# Patient Record
Sex: Male | Born: 1990 | Race: White | Hispanic: No | Marital: Married | Smoking: Current every day smoker
Health system: Southern US, Community
[De-identification: ages and names within clinical notes are randomized; demographics above are authoritative.]

## PROBLEM LIST (undated history)

## (undated) DIAGNOSIS — K029 Dental caries, unspecified: Secondary | ICD-10-CM

## (undated) HISTORY — PX: OTHER SURGICAL HISTORY: SHX169

---
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
## 2009-10-05 ENCOUNTER — Emergency Department (HOSPITAL_COMMUNITY): Admission: EM | Admit: 2009-10-05 | Discharge: 2009-10-05 | Payer: Self-pay | Admitting: Emergency Medicine

## 2010-04-28 ENCOUNTER — Emergency Department (HOSPITAL_COMMUNITY): Admission: EM | Admit: 2010-04-28 | Discharge: 2010-04-28 | Payer: Self-pay | Admitting: Emergency Medicine

## 2010-05-01 ENCOUNTER — Telehealth: Payer: Self-pay | Admitting: Family Medicine
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
## 2010-09-12 ENCOUNTER — Encounter: Payer: Self-pay | Admitting: *Deleted

## 2010-12-09 ENCOUNTER — Emergency Department (HOSPITAL_COMMUNITY)
Admission: EM | Admit: 2010-12-09 | Discharge: 2010-12-09 | Disposition: A | Discharge status: Home / Self Care | Payer: Self-pay | Attending: Emergency Medicine | Admitting: Emergency Medicine

## 2010-12-09 DIAGNOSIS — K029 Dental caries, unspecified: Secondary | ICD-10-CM | POA: Insufficient documentation

## 2010-12-09 DIAGNOSIS — K089 Disorder of teeth and supporting structures, unspecified: Secondary | ICD-10-CM | POA: Insufficient documentation

## 2011-10-18 ENCOUNTER — Emergency Department (HOSPITAL_COMMUNITY): Payer: Self-pay

## 2011-10-18 ENCOUNTER — Emergency Department (HOSPITAL_COMMUNITY)
Admission: EM | Admit: 2011-10-18 | Discharge: 2011-10-18 | Disposition: A | Discharge status: Home / Self Care | Payer: Self-pay | Attending: Emergency Medicine | Admitting: Emergency Medicine

## 2011-10-18 ENCOUNTER — Encounter (HOSPITAL_COMMUNITY): Payer: Self-pay | Admitting: Emergency Medicine

## 2011-10-18 DIAGNOSIS — M25529 Pain in unspecified elbow: Secondary | ICD-10-CM | POA: Insufficient documentation

## 2011-10-18 DIAGNOSIS — F17200 Nicotine dependence, unspecified, uncomplicated: Secondary | ICD-10-CM | POA: Insufficient documentation

## 2011-10-18 DIAGNOSIS — M79609 Pain in unspecified limb: Secondary | ICD-10-CM | POA: Insufficient documentation

## 2011-10-18 DIAGNOSIS — M2550 Pain in unspecified joint: Secondary | ICD-10-CM | POA: Insufficient documentation

## 2011-10-18 MED ORDER — HYDROCODONE-ACETAMINOPHEN 5-325 MG PO TABS
1.0000 | ORAL_TABLET | Freq: Once | ORAL | Status: AC
  Administered 2011-10-18: 1 via ORAL
  Filled 2011-10-18: qty 1

## 2011-10-18 MED ORDER — IBUPROFEN 800 MG PO TABS
800.0000 mg | ORAL_TABLET | Freq: Once | ORAL | Status: AC
  Administered 2011-10-18: 800 mg via ORAL
  Filled 2011-10-18: qty 1

## 2011-10-18 MED ORDER — NAPROXEN 375 MG PO TABS: 375.0000 mg | ORAL_TABLET | Freq: Two times a day (BID) | ORAL | Status: DC

## 2011-10-18 MED ORDER — HYDROCODONE-ACETAMINOPHEN 5-325 MG PO TABS: 1.0000 | ORAL_TABLET | Freq: Four times a day (QID) | ORAL | Status: AC | PRN

## 2011-10-18 NOTE — ED Notes (Signed)
Pt presented to the Er with c/o right arm pain, states started yesterday, pt further explains that it could be possible spider bite and the he noted knots to the right arm, wrist, brachial area and under arm. Pt reports pain level as 7/10
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
## 2011-10-18 NOTE — Discharge Instructions (Signed)
Cryotherapy  Cryotherapy means treatment with cold. Ice or gel packs can be used to reduce both pain and swelling. Ice is the most helpful within the first 24 to 48 hours after an injury or flareup from overusing a muscle or joint. Sprains, strains, spasms, burning pain, shooting pain, and aches can all be eased with ice. Ice can also be used when recovering from surgery. Ice is effective, has very few side effects, and is safe for most people to use.  PRECAUTIONS    Ice is not a safe treatment option for people with:   Raynaud's phenomenon. This is a condition affecting small blood vessels in the extremities. Exposure to cold may cause your problems to return.    Cold hypersensitivity. There are many forms of cold hypersensitivity, including:    Cold urticaria. Red, itchy hives appear on the skin when the tissues begin to warm after being iced.    Cold erythema. This is a red, itchy rash caused by exposure to cold.    Cold hemoglobinuria. Red blood cells break down when the tissues begin to warm after being iced. The hemoglobin that carry oxygen are passed into the urine because they cannot combine with blood proteins fast enough.    Numbness or altered sensitivity in the area being iced.   If you have any of the following conditions, do not use ice until you have discussed cryotherapy with your caregiver:   Heart conditions, such as arrhythmia, angina, or chronic heart disease.    High blood pressure.    Healing wounds or open skin in the area being iced.    Current infections.    Rheumatoid arthritis.    Poor circulation.    Diabetes.   Ice slows the blood flow in the region it is applied. This is beneficial when trying to stop inflamed tissues from spreading irritating chemicals to surrounding tissues. However, if you expose your skin to cold temperatures for too long or without the proper protection, you can damage your skin or nerves. Watch for signs of skin damage due to cold.   HOME CARE INSTRUCTIONS  Follow these tips to use ice and cold packs safely.   Place a dry or damp towel between the ice and skin. A damp towel will cool the skin more quickly, so you may need to shorten the time that the ice is used.    For a more rapid response, add gentle compression to the ice.    Ice for no more than 10 to 20 minutes at a time. The bonier the area you are icing, the less time it will take to get the benefits of ice.    Check your skin after 5 minutes to make sure there are no signs of a poor response to cold or skin damage.    Rest 20 minutes or more in between uses.    Once your skin is numb, you can end your treatment. You can test numbness by very lightly touching your skin. The touch should be so light that you do not see the skin dimple from the pressure of your fingertip. When using ice, most people will feel these normal sensations in this order: cold, burning, aching, and numbness.    Do not use ice on someone who cannot communicate their responses to pain, such as small children or people with dementia.   HOW TO MAKE AN ICE PACK  Ice packs are the most common way to use ice therapy. Other methods include ice   massage, ice baths, and cryo-sprays. Muscle creams that cause a cold, tingly feeling do not offer the same benefits that ice offers and should not be used as a substitute unless recommended by your caregiver.  To make an ice pack, do one of the following:   Place crushed ice or a bag of frozen vegetables in a sealable plastic bag. Squeeze out the excess air. Place this bag inside another plastic bag. Slide the bag into a pillowcase or place a damp towel between your skin and the bag.     Mix 3 parts water with 1 part rubbing alcohol. Freeze the mixture in a sealable plastic bag. When you remove the mixture from the freezer, it will be slushy. Squeeze out the excess air. Place this bag inside another plastic bag. Slide the bag into a pillowcase or place a damp towel between your skin and the bag.   SEEK MEDICAL CARE IF:   You develop white spots on your skin. This may give the skin a blotchy (mottled) appearance.    Your skin turns blue or pale.    Your skin becomes waxy or hard.    Your swelling gets worse.   MAKE SURE YOU:     Understand these instructions.    Will watch your condition.    Will get help right away if you are not doing well or get worse.   Document Released: 03/19/2011 Document Revised: 07/12/2011 Document Reviewed: 03/19/2011  [employer] Patient Information 2012 [employer].

## 2011-10-19 NOTE — ED Provider Notes (Signed)
Medical screening examination/treatment/procedure(s) were performed by non-physician practitioner and as supervising physician I was immediately available for consultation/collaboration.   Hurman Horn, MD 10/19/11 2330

## 2011-11-13 ENCOUNTER — Emergency Department (HOSPITAL_COMMUNITY)
Admission: EM | Admit: 2011-11-13 | Discharge: 2011-11-13 | Disposition: A | Discharge status: Home / Self Care | Payer: Self-pay | Attending: Emergency Medicine | Admitting: Emergency Medicine

## 2011-11-13 DIAGNOSIS — IMO0002 Reserved for concepts with insufficient information to code with codable children: Secondary | ICD-10-CM

## 2011-11-13 DIAGNOSIS — L723 Sebaceous cyst: Secondary | ICD-10-CM | POA: Insufficient documentation

## 2011-11-13 DIAGNOSIS — IMO0001 Reserved for inherently not codable concepts without codable children: Secondary | ICD-10-CM | POA: Insufficient documentation

## 2011-11-13 MED ORDER — HYDROCODONE-ACETAMINOPHEN 5-500 MG PO TABS: 1.0000 | ORAL_TABLET | Freq: Four times a day (QID) | ORAL | Status: AC | PRN

## 2011-11-13 NOTE — Discharge Instructions (Signed)
Ganglion A ganglion is a swelling under the skin that is filled with a thick, jelly-like substance. It is a synovial cyst. This is caused by a break (rupture) of the joint lining from the joint space. A ganglion often occurs near an area of repeated minor trauma (damage caused by an accident). Trauma may also be a repetitive movement at work or in a sport. TREATMENT   It often goes away without treatment. It may reappear later. Sometimes a ganglion may need to be surgically removed. Often they are drained and injected with a steroid. Sometimes they respond to:  Rest.   Splinting.  HOME CARE INSTRUCTIONS    Your caregiver will decide the best way of treating your ganglion. Do not try to break the ganglion yourself by pressing on it, poking it with a needle, or hitting it with a heavy object.   Use medications as directed.  SEEK MEDICAL CARE IF:    The ganglion becomes larger or more painful.   You have increased redness or swelling.   You have weakness or numbness in your hand or wrist.  MAKE SURE YOU:    Understand these instructions.   Will watch your condition.   Will get help right away if you are not doing well or get worse.  Document Released: 07/20/2000 Document Revised: 07/12/2011 Document Reviewed: 09/16/2007 [employer] Patient Information 2012 [employer]. 
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
## 2011-11-13 NOTE — ED Notes (Signed)
Was seen here 1 month ago for knot on inner aspect of right arm. Was dx'd with "muscle spasm". Knot has gotten bigger and more tender.

## 2011-11-14 NOTE — ED Provider Notes (Signed)
Medical screening examination/treatment/procedure(s) were performed by non-physician practitioner and as supervising physician I was immediately available for consultation/collaboration.  Cheri Guppy, MD 11/14/11 [phone]

## 2012-01-31 ENCOUNTER — Encounter (HOSPITAL_COMMUNITY): Payer: Self-pay | Admitting: *Deleted

## 2012-01-31 ENCOUNTER — Emergency Department (HOSPITAL_COMMUNITY)
Admission: EM | Admit: 2012-01-31 | Discharge: 2012-01-31 | Disposition: A | Discharge status: Home / Self Care | Payer: Self-pay | Attending: Emergency Medicine | Admitting: Emergency Medicine

## 2012-01-31 DIAGNOSIS — F17200 Nicotine dependence, unspecified, uncomplicated: Secondary | ICD-10-CM | POA: Insufficient documentation

## 2012-01-31 DIAGNOSIS — M25562 Pain in left knee: Secondary | ICD-10-CM

## 2012-01-31 DIAGNOSIS — M25569 Pain in unspecified knee: Secondary | ICD-10-CM | POA: Insufficient documentation

## 2012-01-31 DIAGNOSIS — M549 Dorsalgia, unspecified: Secondary | ICD-10-CM | POA: Insufficient documentation

## 2012-01-31 MED ORDER — NAPROXEN 375 MG PO TABS: 375.0000 mg | ORAL_TABLET | Freq: Two times a day (BID) | ORAL | Status: DC

## 2012-01-31 MED ORDER — HYDROCODONE-ACETAMINOPHEN 5-325 MG PO TABS: 1.0000 | ORAL_TABLET | ORAL | Status: AC | PRN
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
## 2012-01-31 NOTE — Discharge Instructions (Signed)
Back Exercises Back exercises help treat and prevent back injuries. The goal of back exercises is to increase the strength of your abdominal and back muscles and the flexibility of your back. These exercises should be started when you no longer have back pain. Back exercises include:  Pelvic Tilt. Lie on your back with your knees bent. Tilt your pelvis until the lower part of your back is against the floor. Hold this position 5 to 10 sec and repeat 5 to 10 times.   Knee to Chest. Pull first 1 knee up against your chest and hold for 20 to 30 seconds, repeat this with the other knee, and then both knees. This may be done with the other leg straight or bent, whichever feels better.   Sit-Ups or Curl-Ups. Bend your knees 90 degrees. Start with tilting your pelvis, and do a partial, slow sit-up, lifting your trunk only 30 to 45 degrees off the floor. Take at least 2 to 3 seconds for each sit-up. Do not do sit-ups with your knees out straight. If partial sit-ups are difficult, simply do the above but with only tightening your abdominal muscles and holding it as directed.   Hip-Lift. Lie on your back with your knees flexed 90 degrees. Push down with your feet and shoulders as you raise your hips a couple inches off the floor; hold for 10 seconds, repeat 5 to 10 times.   Back arches. Lie on your stomach, propping yourself up on bent elbows. Slowly press on your hands, causing an arch in your low back. Repeat 3 to 5 times. Any initial stiffness and discomfort should lessen with repetition over time.   Shoulder-Lifts. Lie face down with arms beside your body. Keep hips and torso pressed to floor as you slowly lift your head and shoulders off the floor.  Do not overdo your exercises, especially in the beginning. Exercises may cause you some mild back discomfort which lasts for a few minutes; however, if the pain is more severe, or lasts for more than 15 minutes, do not continue exercises until you see your  caregiver. Improvement with exercise therapy for back problems is slow.  See your caregivers for assistance with developing a proper back exercise program. Document Released: 08/30/2004 Document Revised: 07/12/2011 Document Reviewed: 07/23/2005 [facility] Patient Information 2012 [place], [place].Arthralgia Your caregiver has diagnosed you as suffering from an arthralgia. Arthralgia means there is pain in a joint. This can come from many reasons including:  Bruising the joint which causes soreness (inflammation) in the joint.   Wear and tear on the joints which occur as we grow older (osteoarthritis).   Overusing the joint.   Various forms of arthritis.   Infections of the joint.  Regardless of the cause of pain in your joint, most of these different pains respond to anti-inflammatory drugs and rest. The exception to this is when a joint is infected, and these cases are treated with antibiotics, if it is a bacterial infection. HOME CARE INSTRUCTIONS   Rest the injured area for as long as directed by your caregiver. Then slowly start using the joint as directed by your caregiver and as the pain allows. Crutches as directed may be useful if the ankles, knees or hips are involved. If the knee was splinted or casted, continue use and care as directed. If an stretchy or elastic wrapping bandage has been applied today, it should be removed and re-applied every 3 to 4 hours. It should not be applied tightly, but firmly enough  to keep swelling down. Watch toes and feet for swelling, bluish discoloration, coldness, numbness or excessive pain. If any of these problems (symptoms) occur, remove the ace bandage and re-apply more loosely. If these symptoms persist, contact your caregiver or return to this location.   For the first 24 hours, keep the injured extremity elevated on pillows while lying down.   Apply ice for 15 to 20 minutes to the sore joint every couple hours while awake for the first half day.  Then 3 to 4 times per day for the first 48 hours. Put the ice in a plastic bag and place a towel between the bag of ice and your skin.   Wear any splinting, casting, elastic bandage applications, or slings as instructed.   Only take over-the-counter or prescription medicines for pain, discomfort, or fever as directed by your caregiver. Do not use aspirin immediately after the injury unless instructed by your physician. Aspirin can cause increased bleeding and bruising of the tissues.   If you were given crutches, continue to use them as instructed and do not resume weight bearing on the sore joint until instructed.  Persistent pain and inability to use the sore joint as directed for more than 2 to 3 days are warning signs indicating that you should see a caregiver for a follow-up visit as soon as possible. Initially, a hairline fracture (break in bone) may not be evident on X-rays. Persistent pain and swelling indicate that further evaluation, non-weight bearing or use of the joint (use of crutches or slings as instructed), or further X-rays are indicated. X-rays may sometimes not show a small fracture until a week or 10 days later. Make a follow-up appointment with your own caregiver or one to whom we have referred you. A radiologist (specialist in reading X-rays) may read your X-rays. Make sure you know how you are to obtain your X-ray results. Do not assume everything is normal if you do not hear from [place]. SEEK MEDICAL CARE IF: Bruising, swelling, or pain increases. SEEK IMMEDIATE MEDICAL CARE IF:   Your fingers or toes are numb or blue.   The pain is not responding to medications and continues to stay the same or get worse.   The pain in your joint becomes severe.   You develop a fever over 102 F (38.9 C).   It becomes impossible to move or use the joint.  MAKE SURE YOU:   Understand these instructions.   Will watch your condition.   Will get help right away if you are not doing well  or get worse.  Document Released: 07/23/2005 Document Revised: 07/12/2011 Document Reviewed: 03/10/2008 [facility] Patient Information 2012 [place], [place].Back Pain, Adult Low back pain is very common. About 1 in 5 people have back pain.The cause of low back pain is rarely dangerous. The pain often gets better over time.About half of people with a sudden onset of back pain feel better in just 2 weeks. About 8 in 10 people feel better by 6 weeks.  CAUSES Some common causes of back pain include:  Strain of the muscles or ligaments supporting the spine.   Wear and tear (degeneration) of the spinal discs.   Arthritis.   Direct injury to the back.  DIAGNOSIS Most of the time, the direct cause of low back pain is not known.However, back pain can be treated effectively even when the exact cause of the pain is unknown.Answering your caregiver's questions about your overall health and symptoms is one of the  most accurate ways to make sure the cause of your pain is not dangerous. If your caregiver needs more information, he or she may order lab work or imaging tests (X-rays or MRIs).However, even if imaging tests show changes in your back, this usually does not require surgery. HOME CARE INSTRUCTIONS For many people, back pain returns.Since low back pain is rarely dangerous, it is often a condition that people can learn to [facility] their own.   Remain active. It is stressful on the back to sit or stand in one place. Do not sit, drive, or stand in one place for more than 30 minutes at a time. Take short walks on level surfaces as soon as pain allows.Try to increase the length of time you walk each day.   Do not stay in bed.Resting more than 1 or 2 days can delay your recovery.   Do not avoid exercise or work.Your body is made to move.It is not dangerous to be active, even though your back may hurt.Your back will likely heal faster if you return to being active before your pain is gone.   Pay  attention to your body when you bend and lift. Many people have less discomfortwhen lifting if they bend their knees, keep the load close to their bodies,and avoid twisting. Often, the most comfortable positions are those that put less stress on your recovering back.   Find a comfortable position to sleep. Use a firm mattress and lie on your side with your knees slightly bent. If you lie on your back, put a pillow under your knees.   Only take over-the-counter or prescription medicines as directed by your caregiver. Over-the-counter medicines to reduce pain and inflammation are often the most helpful.Your caregiver may prescribe muscle relaxant drugs.These medicines help dull your pain so you can more quickly return to your normal activities and healthy exercise.   Put ice on the injured area.   Put ice in a plastic bag.   Place a towel between your skin and the bag.   Leave the ice on for 15 to 20 minutes, 3 to 4 times a day for the first 2 to 3 days. After that, ice and heat may be alternated to reduce pain and spasms.   Ask your caregiver about trying back exercises and gentle massage. This may be of some benefit.   Avoid feeling anxious or stressed.Stress increases muscle tension and can worsen back pain.It is important to recognize when you are anxious or stressed and learn ways to manage it.Exercise is a great option.  SEEK MEDICAL CARE IF:  You have pain that is not relieved with rest or medicine.   You have pain that does not improve in 1 week.   You have new symptoms.   You are generally not feeling well.  SEEK IMMEDIATE MEDICAL CARE IF:   You have pain that radiates from your back into your legs.   You develop new bowel or bladder control problems.   You have unusual weakness or numbness in your arms or legs.   You develop nausea or vomiting.   You develop abdominal pain.   You feel faint.  Document Released: 07/23/2005 Document Revised: 07/12/2011 Document  Reviewed: 12/11/2010 [facility] Patient Information 2012 [place], [place].  RESOURCE GUIDE  Dental Problems  Patients with Medicaid: [facility] [phone] [address]  [address] Phone:  [phone]                                                  Phone:  [phone]  If unable to pay or uninsured, contact:  [facility] or [facility]. to become qualified for the adult dental clinic.  Chronic Pain Problems Contact [facility]  [phone] Patients need to be referred by their primary care doctor.  Insufficient Money for Medicine Contact [employer]:  call "211" or [facility] [phone].  No Primary Care Doctor Call [facility]  [phone] Other agencies that provide inexpensive medical care    [facility]  [phone]    [facility]  [phone]    [facility]  [phone]    [facility]  [phone]    [facility]  [phone]    [facility]  [phone]  Psychological Services [facility]  [phone] [facility] Services  [phone] [facility]   [phone] (emergency services [phone])  Substance Abuse Resources [facility]  [phone] [facility] [phone] [facility] [place] [phone] Floydene Flock [phone] Residential & Outpatient Substance Abuse Program  [phone]  Abuse/Neglect [facility] Child Abuse Hotline 319-363-7018 [facility] Child Abuse Hotline 231-089-8668 (After Hours)  Emergency Shelter [facility] [phone]  Maternity Homes Room at the [place] [facility] [phone] [facility] [phone]  MRSA Hotline #:   514-118-8914    [facility] Resources  [facility] [place]     [employer]                          [facility]  [address] [place]                       [address]      [address]  Blondell Reveal Phone:  [phone]                                   Phone:  [phone]                 Phone:  [phone]  [facility] Phone:  [phone]  [facility] Child Abuse Hotline [phone])270-8898 [phone] (After Hours)

## 2012-01-31 NOTE — ED Provider Notes (Signed)
Medical screening examination/treatment/procedure(s) were performed by non-physician practitioner and as supervising physician I was immediately available for consultation/collaboration.    Nelia Shi, MD 01/31/12 [phone]

## 2012-01-31 NOTE — ED Notes (Signed)
Pt states he was lifting up equiptment. Pt state a pain shot up his back and both knees. Pt states he has knee pain for one month and it has became worst. No c/o injury

## 2012-03-03 ENCOUNTER — Emergency Department (HOSPITAL_COMMUNITY)
Admission: EM | Admit: 2012-03-03 | Discharge: 2012-03-03 | Disposition: A | Discharge status: Home / Self Care | Payer: Self-pay | Attending: Emergency Medicine | Admitting: Emergency Medicine

## 2012-03-03 ENCOUNTER — Emergency Department (HOSPITAL_COMMUNITY): Payer: Self-pay

## 2012-03-03 ENCOUNTER — Encounter (HOSPITAL_COMMUNITY): Payer: Self-pay | Admitting: Emergency Medicine

## 2012-03-03 DIAGNOSIS — M25569 Pain in unspecified knee: Secondary | ICD-10-CM | POA: Insufficient documentation

## 2012-03-03 DIAGNOSIS — F17200 Nicotine dependence, unspecified, uncomplicated: Secondary | ICD-10-CM | POA: Insufficient documentation

## 2012-03-03 DIAGNOSIS — M25561 Pain in right knee: Secondary | ICD-10-CM

## 2012-03-03 MED ORDER — NAPROXEN 375 MG PO TABS: 375.0000 mg | ORAL_TABLET | Freq: Two times a day (BID) | ORAL | Status: DC

## 2012-03-03 MED ORDER — NAPROXEN 500 MG PO TABS
250.0000 mg | ORAL_TABLET | Freq: Two times a day (BID) | ORAL | Status: DC
  Administered 2012-03-03: 250 mg via ORAL
  Filled 2012-03-03: qty 2

## 2012-03-03 NOTE — ED Notes (Signed)
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 Last took naproxyn 3 weeks ago. Pt ambulatory at triage.
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
## 2012-03-05 NOTE — ED Provider Notes (Signed)
Medical screening examination/treatment/procedure(s) were performed by non-physician practitioner and as supervising physician I was immediately available for consultation/collaboration.  Harley Fitzwater, MD 03/05/12 0010 

## 2012-09-07 ENCOUNTER — Emergency Department (HOSPITAL_COMMUNITY)
Admission: EM | Admit: 2012-09-07 | Discharge: 2012-09-07 | Disposition: A | Discharge status: Home / Self Care | Payer: BC Managed Care – PPO | Attending: Emergency Medicine | Admitting: Emergency Medicine

## 2012-09-07 ENCOUNTER — Encounter (HOSPITAL_COMMUNITY): Payer: Self-pay | Admitting: Emergency Medicine

## 2012-09-07 DIAGNOSIS — Z87828 Personal history of other (healed) physical injury and trauma: Secondary | ICD-10-CM | POA: Insufficient documentation

## 2012-09-07 DIAGNOSIS — M545 Low back pain: Secondary | ICD-10-CM

## 2012-09-07 DIAGNOSIS — Y990 Civilian activity done for income or pay: Secondary | ICD-10-CM | POA: Insufficient documentation

## 2012-09-07 DIAGNOSIS — X500XXA Overexertion from strenuous movement or load, initial encounter: Secondary | ICD-10-CM | POA: Insufficient documentation

## 2012-09-07 DIAGNOSIS — Y9389 Activity, other specified: Secondary | ICD-10-CM | POA: Insufficient documentation

## 2012-09-07 DIAGNOSIS — F17200 Nicotine dependence, unspecified, uncomplicated: Secondary | ICD-10-CM | POA: Insufficient documentation

## 2012-09-07 DIAGNOSIS — IMO0002 Reserved for concepts with insufficient information to code with codable children: Secondary | ICD-10-CM | POA: Insufficient documentation

## 2012-09-07 DIAGNOSIS — Y9269 Other specified industrial and construction area as the place of occurrence of the external cause: Secondary | ICD-10-CM | POA: Insufficient documentation

## 2012-09-07 MED ORDER — DIAZEPAM 5 MG PO TABS
5.0000 mg | ORAL_TABLET | Freq: Once | ORAL | Status: AC
  Administered 2012-09-07: 5 mg via ORAL
  Filled 2012-09-07: qty 1

## 2012-09-07 MED ORDER — DIAZEPAM 5 MG PO TABS: 5.0000 mg | ORAL_TABLET | Freq: Two times a day (BID) | ORAL | Status: DC

## 2012-09-07 MED ORDER — IBUPROFEN 400 MG PO TABS
800.0000 mg | ORAL_TABLET | Freq: Once | ORAL | Status: AC
  Administered 2012-09-07: 800 mg via ORAL
  Filled 2012-09-07: qty 2

## 2012-09-07 NOTE — ED Notes (Signed)
Pt waiting to be triaged by RN in triage room 2.

## 2012-09-07 NOTE — ED Notes (Signed)
Stated back hurts so bad from lifting at work

## 2012-09-07 NOTE — ED Notes (Signed)
Patient reports that he was lifting heavy wood at work this week and started to have back pain on Friday; patient reports that pain has not gotten any better over the weekend.  Patient ambulatory in triage; able to move all extremities without difficulty.
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
## 2012-09-09 NOTE — ED Provider Notes (Signed)
Medical screening examination/treatment/procedure(s) were performed by non-physician practitioner and as supervising physician I was immediately available for consultation/collaboration.  Doug Sou, MD 09/09/12 1331

## 2013-08-18 ENCOUNTER — Encounter (HOSPITAL_COMMUNITY): Payer: Self-pay | Admitting: Emergency Medicine

## 2013-08-18 ENCOUNTER — Emergency Department (HOSPITAL_COMMUNITY)
Admission: EM | Admit: 2013-08-18 | Discharge: 2013-08-18 | Disposition: A | Discharge status: Home / Self Care | Payer: Medicaid Other | Attending: Emergency Medicine | Admitting: Emergency Medicine

## 2013-08-18 DIAGNOSIS — IMO0002 Reserved for concepts with insufficient information to code with codable children: Secondary | ICD-10-CM | POA: Insufficient documentation

## 2013-08-18 DIAGNOSIS — M545 Low back pain, unspecified: Secondary | ICD-10-CM

## 2013-08-18 DIAGNOSIS — X500XXA Overexertion from strenuous movement or load, initial encounter: Secondary | ICD-10-CM | POA: Insufficient documentation

## 2013-08-18 DIAGNOSIS — Z87828 Personal history of other (healed) physical injury and trauma: Secondary | ICD-10-CM | POA: Insufficient documentation

## 2013-08-18 DIAGNOSIS — Y9289 Other specified places as the place of occurrence of the external cause: Secondary | ICD-10-CM | POA: Insufficient documentation

## 2013-08-18 DIAGNOSIS — Y990 Civilian activity done for income or pay: Secondary | ICD-10-CM | POA: Insufficient documentation

## 2013-08-18 DIAGNOSIS — Y9389 Activity, other specified: Secondary | ICD-10-CM | POA: Insufficient documentation

## 2013-08-18 DIAGNOSIS — F17200 Nicotine dependence, unspecified, uncomplicated: Secondary | ICD-10-CM | POA: Insufficient documentation

## 2013-08-18 MED ORDER — METHOCARBAMOL 500 MG PO TABS: 500.0000 mg | ORAL_TABLET | Freq: Two times a day (BID) | ORAL | Status: DC

## 2013-08-18 MED ORDER — MELOXICAM 7.5 MG PO TABS: 15.0000 mg | ORAL_TABLET | Freq: Every day | ORAL | Status: DC

## 2013-08-18 MED ORDER — OXYCODONE-ACETAMINOPHEN 5-325 MG PO TABS
2.0000 | ORAL_TABLET | Freq: Once | ORAL | Status: AC
  Administered 2013-08-18: 2 via ORAL
  Filled 2013-08-18: qty 2
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
## 2013-08-18 NOTE — ED Notes (Signed)
Patient reports that he was lifting something at work and pulled his back.

## 2013-08-18 NOTE — Discharge Instructions (Signed)
Apply ice to your back at least 30 minutes 4 times per day until symptoms improve. Take mobic and robaxin as prescribed. Refrain from strenuous activity or heavy lifting. Return if you develop fevers, loss of bowel/bladder function, numbness of your genitals, or an inability to walk.  Back Pain, Adult Low back pain is very common. About 1 in 5 people have back pain.The cause of low back pain is rarely dangerous. The pain often gets better over time.About half of people with a sudden onset of back pain feel better in just 2 weeks. About 8 in 10 people feel better by 6 weeks.  CAUSES Some common causes of back pain include:  Strain of the muscles or ligaments supporting the spine.  Wear and tear (degeneration) of the spinal discs.  Arthritis.  Direct injury to the back. DIAGNOSIS Most of the time, the direct cause of low back pain is not known.However, back pain can be treated effectively even when the exact cause of the pain is unknown.Answering your caregiver's questions about your overall health and symptoms is one of the most accurate ways to make sure the cause of your pain is not dangerous. If your caregiver needs more information, he or she may order lab work or imaging tests (X-rays or MRIs).However, even if imaging tests show changes in your back, this usually does not require surgery. HOME CARE INSTRUCTIONS For many people, back pain returns.Since low back pain is rarely dangerous, it is often a condition that people can learn to [facility] their own.   Remain active. It is stressful on the back to sit or stand in one place. Do not sit, drive, or stand in one place for more than 30 minutes at a time. Take short walks on level surfaces as soon as pain allows.Try to increase the length of time you walk each day.  Do not stay in bed.Resting more than 1 or 2 days can delay your recovery.  Do not avoid exercise or work.Your body is made to move.It is not dangerous to be active,  even though your back may hurt.Your back will likely heal faster if you return to being active before your pain is gone.  Pay attention to your body when you bend and lift. Many people have less discomfortwhen lifting if they bend their knees, keep the load close to their bodies,and avoid twisting. Often, the most comfortable positions are those that put less stress on your recovering back.  Find a comfortable position to sleep. Use a firm mattress and lie on your side with your knees slightly bent. If you lie on your back, put a pillow under your knees.  Only take over-the-counter or prescription medicines as directed by your caregiver. Over-the-counter medicines to reduce pain and inflammation are often the most helpful.Your caregiver may prescribe muscle relaxant drugs.These medicines help dull your pain so you can more quickly return to your normal activities and healthy exercise.  Put ice on the injured area.  Put ice in a plastic bag.  Place a towel between your skin and the bag.  Leave the ice on for 15-20 minutes, 03-04 times a day for the first 2 to 3 days. After that, ice and heat may be alternated to reduce pain and spasms.  Ask your caregiver about trying back exercises and gentle massage. This may be of some benefit.  Avoid feeling anxious or stressed.Stress increases muscle tension and can worsen back pain.It is important to recognize when you are anxious or stressed and learn  ways to manage it.Exercise is a great option. SEEK MEDICAL CARE IF:  You have pain that is not relieved with rest or medicine.  You have pain that does not improve in 1 week.  You have new symptoms.  You are generally not feeling well. SEEK IMMEDIATE MEDICAL CARE IF:   You have pain that radiates from your back into your legs.  You develop new bowel or bladder control problems.  You have unusual weakness or numbness in your arms or legs.  You develop nausea or vomiting.  You develop  abdominal pain.  You feel faint. Document Released: 07/23/2005 Document Revised: 01/22/2012 Document Reviewed: 12/11/2010 [facility] Patient Information 2014 [place], [place].

## 2013-08-21 NOTE — ED Provider Notes (Signed)
Medical screening examination/treatment/procedure(s) were performed by non-physician practitioner and as supervising physician I was immediately available for consultation/collaboration.  EKG Interpretation   None         Audree CamelScott T Margarette Vannatter, MD 08/21/13 [phone]

## 2014-01-18 ENCOUNTER — Emergency Department (HOSPITAL_COMMUNITY): Payer: BC Managed Care – PPO

## 2014-01-18 ENCOUNTER — Emergency Department (HOSPITAL_COMMUNITY)
Admission: EM | Admit: 2014-01-18 | Discharge: 2014-01-18 | Disposition: A | Discharge status: Home / Self Care | Payer: BC Managed Care – PPO | Attending: Emergency Medicine | Admitting: Emergency Medicine

## 2014-01-18 ENCOUNTER — Encounter (HOSPITAL_COMMUNITY): Payer: Self-pay | Admitting: Emergency Medicine

## 2014-01-18 DIAGNOSIS — Y92410 Unspecified street and highway as the place of occurrence of the external cause: Secondary | ICD-10-CM | POA: Insufficient documentation

## 2014-01-18 DIAGNOSIS — Y9389 Activity, other specified: Secondary | ICD-10-CM | POA: Insufficient documentation

## 2014-01-18 DIAGNOSIS — F17200 Nicotine dependence, unspecified, uncomplicated: Secondary | ICD-10-CM | POA: Insufficient documentation

## 2014-01-18 DIAGNOSIS — S6990XA Unspecified injury of unspecified wrist, hand and finger(s), initial encounter: Secondary | ICD-10-CM | POA: Insufficient documentation

## 2014-01-18 DIAGNOSIS — T148XXA Other injury of unspecified body region, initial encounter: Secondary | ICD-10-CM

## 2014-01-18 DIAGNOSIS — S0990XA Unspecified injury of head, initial encounter: Secondary | ICD-10-CM | POA: Insufficient documentation

## 2014-01-18 DIAGNOSIS — IMO0002 Reserved for concepts with insufficient information to code with codable children: Secondary | ICD-10-CM | POA: Insufficient documentation

## 2014-01-18 DIAGNOSIS — Z79899 Other long term (current) drug therapy: Secondary | ICD-10-CM | POA: Insufficient documentation

## 2014-01-18 MED ORDER — METHOCARBAMOL 500 MG PO TABS: 1000.0000 mg | ORAL_TABLET | Freq: Four times a day (QID) | ORAL | Status: DC

## 2014-01-18 MED ORDER — TRAMADOL HCL 50 MG PO TABS: 50.0000 mg | ORAL_TABLET | Freq: Four times a day (QID) | ORAL | Status: DC | PRN

## 2014-01-18 MED ORDER — OXYCODONE-ACETAMINOPHEN 5-325 MG PO TABS
1.0000 | ORAL_TABLET | Freq: Once | ORAL | Status: AC
  Administered 2014-01-18: 1 via ORAL
  Filled 2014-01-18: qty 1

## 2014-01-18 MED ORDER — NAPROXEN 500 MG PO TABS: 500.0000 mg | ORAL_TABLET | Freq: Two times a day (BID) | ORAL | Status: DC

## 2014-01-18 NOTE — ED Notes (Signed)
Patient helped up to the bathroom.  
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
## 2014-01-18 NOTE — ED Notes (Signed)
Received bedside report from Chad, RN 

## 2014-01-18 NOTE — ED Notes (Signed)
Patient returned from X-ray 

## 2014-01-18 NOTE — ED Notes (Signed)
Ortho paged about Thumb Spica. Stated they would be down shortly. No thumb spicas in ED Ortho Room.

## 2014-01-18 NOTE — ED Provider Notes (Signed)
Medical screening examination/treatment/procedure(s) were performed by non-physician practitioner and as supervising physician I was immediately available for consultation/collaboration.   EKG Interpretation None        Somtochukwu Woollard W Missie Gehrig, MD 01/18/14 1527 

## 2014-01-18 NOTE — ED Notes (Signed)
Patient refused wheelchair. 

## 2014-01-18 NOTE — ED Notes (Signed)
Patient returned from CT
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
## 2014-01-18 NOTE — ED Notes (Signed)
Patient taken to CT.

## 2014-01-18 NOTE — ED Notes (Signed)
PA made aware of patients pain.  
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
## 2015-02-15 ENCOUNTER — Encounter (HOSPITAL_COMMUNITY): Payer: Self-pay | Admitting: *Deleted

## 2015-02-15 ENCOUNTER — Emergency Department (HOSPITAL_COMMUNITY)
Admission: EM | Admit: 2015-02-15 | Discharge: 2015-02-15 | Disposition: A | Discharge status: Home / Self Care | Payer: Medicaid Other | Attending: Emergency Medicine | Admitting: Emergency Medicine

## 2015-02-15 DIAGNOSIS — Z720 Tobacco use: Secondary | ICD-10-CM | POA: Diagnosis not present

## 2015-02-15 DIAGNOSIS — K029 Dental caries, unspecified: Secondary | ICD-10-CM | POA: Insufficient documentation

## 2015-02-15 DIAGNOSIS — Z791 Long term (current) use of non-steroidal anti-inflammatories (NSAID): Secondary | ICD-10-CM | POA: Insufficient documentation

## 2015-02-15 DIAGNOSIS — Z79899 Other long term (current) drug therapy: Secondary | ICD-10-CM | POA: Insufficient documentation

## 2015-02-15 DIAGNOSIS — K0889 Other specified disorders of teeth and supporting structures: Secondary | ICD-10-CM

## 2015-02-15 DIAGNOSIS — R51 Headache: Secondary | ICD-10-CM | POA: Diagnosis not present

## 2015-02-15 DIAGNOSIS — K088 Other specified disorders of teeth and supporting structures: Secondary | ICD-10-CM | POA: Diagnosis not present

## 2015-02-15 HISTORY — DX: Dental caries, unspecified: K02.9

## 2015-02-15 MED ORDER — LIDOCAINE VISCOUS 2 % MT SOLN
15.0000 mL | Freq: Once | OROMUCOSAL | Status: AC
  Administered 2015-02-15: 15 mL via OROMUCOSAL
  Filled 2015-02-15: qty 15

## 2015-02-15 MED ORDER — LIDOCAINE VISCOUS 2 % MT SOLN: 20.0000 mL | OROMUCOSAL | Status: DC | PRN

## 2015-02-15 MED ORDER — AMOXICILLIN 500 MG PO CAPS: 500.0000 mg | ORAL_CAPSULE | Freq: Three times a day (TID) | ORAL | Status: DC
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
## 2015-02-15 NOTE — Discharge Instructions (Signed)
Please follow up with your oral surgeon to schedule a follow up appointment. Please take your antibiotic until completion. Please alternate between Motrin and Tylenol every three hours for  pain. Please read all discharge instructions and return precautions.     Dental Pain A tooth ache may be caused by cavities (tooth decay). Cavities expose the nerve of the tooth to air and hot or cold temperatures. It may come from an infection or abscess (also called a boil or furuncle) around your tooth. It is also often caused by dental caries (tooth decay). This causes the pain you are having. DIAGNOSIS  Your caregiver can diagnose this problem by exam. TREATMENT   If caused by an infection, it may be treated with medications which kill germs (antibiotics) and pain medications as prescribed by your caregiver. Take medications as directed.  Only take over-the-counter or prescription medicines for pain, discomfort, or fever as directed by your caregiver.  Whether the tooth ache today is caused by infection or dental disease, you should see your dentist as soon as possible for further care. SEEK MEDICAL CARE IF: The exam and treatment you received today has been provided on an emergency basis only. This is not a substitute for complete medical or dental care. If your problem worsens or new problems (symptoms) appear, and you are unable to meet with your dentist, call or return to this location. SEEK IMMEDIATE MEDICAL CARE IF:   You have a fever.  You develop redness and swelling of your face, jaw, or neck.  You are unable to open your mouth.  You have severe pain uncontrolled by pain medicine. MAKE SURE YOU:   Understand these instructions.  Will watch your condition.  Will get help right away if you are not doing well or get worse. Document Released: 07/23/2005 Document Revised: 10/15/2011 Document Reviewed: 03/10/2008 [facility] Patient Information 2015 [employer], [place]. This information is not  intended to replace advice given to you by your health care provider. Make sure you discuss any questions you have with your health care provider.

## 2015-02-15 NOTE — ED Notes (Signed)
Pt reports He has an appt . with oral surgeon in 2 months . Pt reports an acute flair up of pain.

## 2015-02-15 NOTE — ED Notes (Signed)
Declined W/C at D/C and was escorted to lobby by RN. 

## 2015-03-19 ENCOUNTER — Emergency Department (HOSPITAL_COMMUNITY)
Admission: EM | Admit: 2015-03-19 | Discharge: 2015-03-19 | Disposition: A | Discharge status: Home / Self Care | Payer: Medicaid Other | Attending: Emergency Medicine | Admitting: Emergency Medicine

## 2015-03-19 ENCOUNTER — Encounter (HOSPITAL_COMMUNITY): Payer: Self-pay

## 2015-03-19 DIAGNOSIS — Z79899 Other long term (current) drug therapy: Secondary | ICD-10-CM | POA: Insufficient documentation

## 2015-03-19 DIAGNOSIS — Z720 Tobacco use: Secondary | ICD-10-CM | POA: Diagnosis not present

## 2015-03-19 DIAGNOSIS — K029 Dental caries, unspecified: Secondary | ICD-10-CM | POA: Insufficient documentation

## 2015-03-19 DIAGNOSIS — K088 Other specified disorders of teeth and supporting structures: Secondary | ICD-10-CM | POA: Diagnosis present

## 2015-03-19 DIAGNOSIS — Z792 Long term (current) use of antibiotics: Secondary | ICD-10-CM | POA: Diagnosis not present

## 2015-03-19 DIAGNOSIS — Z791 Long term (current) use of non-steroidal anti-inflammatories (NSAID): Secondary | ICD-10-CM | POA: Insufficient documentation

## 2015-03-19 DIAGNOSIS — K08409 Partial loss of teeth, unspecified cause, unspecified class: Secondary | ICD-10-CM

## 2015-03-19 DIAGNOSIS — K08109 Complete loss of teeth, unspecified cause, unspecified class: Secondary | ICD-10-CM | POA: Insufficient documentation

## 2015-03-19 DIAGNOSIS — K0889 Other specified disorders of teeth and supporting structures: Secondary | ICD-10-CM

## 2015-03-19 MED ORDER — OXYCODONE-ACETAMINOPHEN 5-325 MG PO TABS
1.0000 | ORAL_TABLET | Freq: Once | ORAL | Status: AC
  Administered 2015-03-19: 1 via ORAL

## 2015-03-19 MED ORDER — OXYCODONE-ACETAMINOPHEN 5-325 MG PO TABS
ORAL_TABLET | ORAL | Status: AC
  Filled 2015-03-19: qty 1

## 2015-03-19 MED ORDER — IBUPROFEN 800 MG PO TABS: 800.0000 mg | ORAL_TABLET | Freq: Three times a day (TID) | ORAL | Status: DC
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
## 2015-03-19 NOTE — ED Notes (Signed)
Pt reports had 23 teeth pulled 03-10-15 has stitches in mouth that are loose.  Pt is here for pain control and to have stitches checked.  He does not have f/u with oral surgeon scheduled.

## 2015-03-19 NOTE — Discharge Instructions (Signed)
1. Medications: Ibuprofen, usual home medications 2. Treatment: rest, drink plenty of fluids, take medications as prescribed, continue to eat soft foods 3. Follow Up: Please followup with dentistry within 1 week for discussion of your diagnoses and further evaluation after today's visit; if you do not have a primary care doctor use the resource guide provided to find one; Return to the ER for high fevers, difficulty breathing, difficulty swallowing or other concerning symptoms    Dental Pain A tooth ache may be caused by cavities (tooth decay). Cavities expose the nerve of the tooth to air and hot or cold temperatures. It may come from an infection or abscess (also called a boil or furuncle) around your tooth. It is also often caused by dental caries (tooth decay). This causes the pain you are having. DIAGNOSIS  Your caregiver can diagnose this problem by exam. TREATMENT   If caused by an infection, it may be treated with medications which kill germs (antibiotics) and pain medications as prescribed by your caregiver. Take medications as directed.  Only take over-the-counter or prescription medicines for pain, discomfort, or fever as directed by your caregiver.  Whether the tooth ache today is caused by infection or dental disease, you should see your dentist as soon as possible for further care. SEEK MEDICAL CARE IF: The exam and treatment you received today has been provided on an emergency basis only. This is not a substitute for complete medical or dental care. If your problem worsens or new problems (symptoms) appear, and you are unable to meet with your dentist, call or return to this location. SEEK IMMEDIATE MEDICAL CARE IF:   You have a fever.  You develop redness and swelling of your face, jaw, or neck.  You are unable to open your mouth.  You have severe pain uncontrolled by pain medicine. MAKE SURE YOU:   Understand these instructions.  Will watch your condition.  Will get  help right away if you are not doing well or get worse. Document Released: 07/23/2005 Document Revised: 10/15/2011 Document Reviewed: 03/10/2008 [facility] Patient Information 2015 [place], [place]. This information is not intended to replace advice given to you by your health care provider. Make sure you discuss any questions you have with your health care provider.

## 2015-10-20 ENCOUNTER — Emergency Department (HOSPITAL_COMMUNITY)
Admission: EM | Admit: 2015-10-20 | Discharge: 2015-10-20 | Disposition: A | Discharge status: Home / Self Care | Payer: Medicaid Other | Attending: Emergency Medicine | Admitting: Emergency Medicine

## 2015-10-20 ENCOUNTER — Encounter (HOSPITAL_COMMUNITY): Payer: Self-pay

## 2015-10-20 DIAGNOSIS — F17200 Nicotine dependence, unspecified, uncomplicated: Secondary | ICD-10-CM | POA: Diagnosis not present

## 2015-10-20 DIAGNOSIS — Z792 Long term (current) use of antibiotics: Secondary | ICD-10-CM | POA: Diagnosis not present

## 2015-10-20 DIAGNOSIS — M79671 Pain in right foot: Secondary | ICD-10-CM | POA: Diagnosis not present

## 2015-10-20 DIAGNOSIS — Z79899 Other long term (current) drug therapy: Secondary | ICD-10-CM | POA: Diagnosis not present

## 2015-10-20 DIAGNOSIS — Z8719 Personal history of other diseases of the digestive system: Secondary | ICD-10-CM | POA: Diagnosis not present

## 2015-10-20 DIAGNOSIS — Z791 Long term (current) use of non-steroidal anti-inflammatories (NSAID): Secondary | ICD-10-CM | POA: Diagnosis not present

## 2015-10-20 MED ORDER — NAPROXEN 500 MG PO TABS: 500.0000 mg | ORAL_TABLET | Freq: Two times a day (BID) | ORAL | Status: DC
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
## 2015-10-20 NOTE — ED Notes (Signed)
Patient able to ambulate independently with crutches 

## 2015-10-20 NOTE — ED Notes (Signed)
Pt reports pain to heel of right foot, onset 3-4 hours PTA.Marland Kitchen. He is not sure what has caused the pain. Denies injury.

## 2015-10-20 NOTE — ED Notes (Signed)
Ortho paged. 

## 2015-10-20 NOTE — Progress Notes (Signed)
Orthopedic Tech Progress Note Patient Details:  Carson MyrtleMichael D Bruening 04/08/1991 [id number]  Ortho Devices Type of Ortho Device: ASO, Crutches Ortho Device/Splint Location: aso to rle Ortho Device/Splint Interventions: Ordered, Application   Trinna PostMartinez, Simrat Kendrick J 10/20/2015, 9:38 PM

## 2015-11-13 ENCOUNTER — Emergency Department (HOSPITAL_COMMUNITY)
Admission: EM | Admit: 2015-11-13 | Discharge: 2015-11-13 | Disposition: A | Discharge status: Home / Self Care | Payer: Medicaid Other | Attending: Emergency Medicine | Admitting: Emergency Medicine

## 2015-11-13 ENCOUNTER — Encounter (HOSPITAL_COMMUNITY): Payer: Self-pay | Admitting: *Deleted

## 2015-11-13 ENCOUNTER — Emergency Department (HOSPITAL_COMMUNITY): Payer: Medicaid Other

## 2015-11-13 DIAGNOSIS — Y998 Other external cause status: Secondary | ICD-10-CM | POA: Insufficient documentation

## 2015-11-13 DIAGNOSIS — Y9289 Other specified places as the place of occurrence of the external cause: Secondary | ICD-10-CM | POA: Diagnosis not present

## 2015-11-13 DIAGNOSIS — W293XXA Contact with powered garden and outdoor hand tools and machinery, initial encounter: Secondary | ICD-10-CM | POA: Insufficient documentation

## 2015-11-13 DIAGNOSIS — F17200 Nicotine dependence, unspecified, uncomplicated: Secondary | ICD-10-CM | POA: Diagnosis not present

## 2015-11-13 DIAGNOSIS — Z79899 Other long term (current) drug therapy: Secondary | ICD-10-CM | POA: Diagnosis not present

## 2015-11-13 DIAGNOSIS — Z8719 Personal history of other diseases of the digestive system: Secondary | ICD-10-CM | POA: Diagnosis not present

## 2015-11-13 DIAGNOSIS — Z792 Long term (current) use of antibiotics: Secondary | ICD-10-CM | POA: Insufficient documentation

## 2015-11-13 DIAGNOSIS — Z791 Long term (current) use of non-steroidal anti-inflammatories (NSAID): Secondary | ICD-10-CM | POA: Diagnosis not present

## 2015-11-13 DIAGNOSIS — Y9389 Activity, other specified: Secondary | ICD-10-CM | POA: Diagnosis not present

## 2015-11-13 DIAGNOSIS — S6992XA Unspecified injury of left wrist, hand and finger(s), initial encounter: Secondary | ICD-10-CM | POA: Diagnosis present

## 2015-11-13 DIAGNOSIS — S61219A Laceration without foreign body of unspecified finger without damage to nail, initial encounter: Secondary | ICD-10-CM

## 2015-11-13 DIAGNOSIS — S61217A Laceration without foreign body of left little finger without damage to nail, initial encounter: Secondary | ICD-10-CM | POA: Diagnosis not present

## 2015-11-13 MED ORDER — BUPIVACAINE HCL (PF) 0.5 % IJ SOLN
10.0000 mL | Freq: Once | INTRAMUSCULAR | Status: AC
  Administered 2015-11-13: 10 mL
  Filled 2015-11-13: qty 10

## 2015-11-13 MED ORDER — BACITRACIN ZINC 500 UNIT/GM EX OINT: TOPICAL_OINTMENT | Freq: Two times a day (BID) | CUTANEOUS | Status: DC

## 2015-11-13 NOTE — ED Notes (Signed)
The pt was using hedge clippers thei afternoon 1500 and he cut his lt little finger at the tip. The wound has continued to bleed and increasing pain

## 2015-11-13 NOTE — ED Notes (Signed)
Pt washing out laceration with soap and water
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
## 2015-11-13 NOTE — Discharge Instructions (Signed)
You have been seen today for a finger laceration. Your imaging showed no abnormalities. Refer to the attached literature for proper wound care. Keep the wound clean and dry at all times. Return to the ED or go to an urgent care or PCP office in 3 days for wound check. Return to the ED in 11 days for suture removal. Return sooner should signs of infection or dehiscence (wound edges coming apart) arise. May use ibuprofen, naproxen, or Tylenol for pain.  RESOURCE GUIDE  Chronic Pain Problems: Contact [facility]  [phone] Patients need to be referred by their primary care doctor.  Insufficient Money for Medicine: Contact United Way:  call "211" or [facility] [phone].  No Primary Care Doctor: - Call [facility]  [phone] - can help you locate a primary care doctor that  accepts your insurance, provides certain services, etc. - Physician Referral Service- [phone]  Agencies that provide inexpensive medical care: - [facility]  [phone] - [facility]  [phone] - [facility]  [phone] - Women's Clinic  [phone] - [facility]  [phone] [facility]  [phone]  Medicaid-accepting [facility] Providers: - [facility]- [address]  [phone], Mon-Fri 9am-7pm, Sat 9am-1pm - [facility]- [address] [place], [address] [place]  [phone] - [facility]- [address] [place]  [phone] [facility]- [address]  [phone] - Renaye Rakers- [address] [place], [address], [phone]  Only accepts [place] patients after they have their name  applied to their card  Self Pay (no insurance) in [place]: - Sickle Cell Patients: Dr Willey Blade, [facility]  [address] [place], [phone] - [facility]- [address] [place]  [phone]       [facility]- [address]       -     [facility]- see information above (Speak to [employer] if you do not have insurance)       -  [facility]- [address] [place], [phone]       -  [facility]- [address],  [phone]       -  [facility]- [address] [phone]       -  Dr Julio Sicks-  [address], [place], [phone]       -  [facility]- [address] [phone]       -  [facility]- [address] [phone], also [address], [phone]       -    [facility]- [address] [place], [phone], 1st & 3rd Saturday   every month, 10am-1pm  1) Find a Doctor and Pay Out of Pocket Although you won't have to find out who is covered by your insurance plan, it is a good idea to ask around and get recommendations. You will then need to call the office and see if the doctor you have chosen will accept you as a new patient and what types of options they offer for patients who are self-pay. Some doctors offer discounts or will set up payment plans for their patients who do not have insurance, but you will need  to ask so you aren't surprised when you get to your appointment.  2) Contact Your Local Health Department Not all health departments have doctors that can see patients for sick visits, but many do, so it is worth a call to see if yours does. If you don't know where your local health department is, you can check in your phone book. The CDC also has a tool to help you locate your state's health department, and many state websites also have listings of all of their local health departments.  3) Find a Walk-in Clinic If your illness is not likely to be very severe or complicated, you may want to try a walk in clinic. These are popping up all over the country in pharmacies, drugstores, and shopping centers. They're usually staffed by nurse practitioners or  physician assistants that have been trained to treat common illnesses and complaints. They're usually fairly quick and inexpensive. However, if you have serious medical issues or chronic medical problems, these are probably not your best option  STD Testing - [facility] [place] [place] [facility] [place], STD Clinic, [address], [place], phone [phone] or [phone].  Monday - Friday, call for an appointment. [facility] [place] Department of [facility] [place], STD Clinic, [place], [place], phone [phone] or [phone].  Monday - Friday, call for an appointment.  Abuse/Neglect: [facility] [place] Child Abuse Hotline (254)044-4522(336) 779 002 5356 [facility] [place] Child Abuse Hotline 949-229-8283(743) 024-8444 (After Hours)  Emergency Shelter:  [facility] [phone]) [phone]  Maternity Homes: - [facility] [place] [facility] [phone] - [facility] [phone]  MRSA Hotline #:   254-861-8349289-603-4569  [facility]  [facility] [place]  United Way [facility] [address]                 [address]         [address] [place]  [place]                                               [place]                              [place] Phone:  [phone]                                  Phone:  [phone]                   Phone:  [phone]  [facility], [phone] - [facility] - [phone]- [phone]       -     [facility] in [place], [address],                                  [phone], [facility]  [place] Child Abuse Hotline 801-742-8192(336) 520-526-0710 or 502-652-7459(336) [phone] (After Hours)   Behavioral Health Services  Substance Abuse Resources: - [facility]  [phone] - [facility] [phone] - [facility] [phone] Floydene Flock[facility] [phone] - Residential & Outpatient  Substance  Abuse Program  [phone]  Psychological Services: [facility]  [phone] Services  [phone] - [facility], [phone] [place]. [address], [place], ACCESS LINE: (713)[phone] or [phone], [employer]  Dental Assistance  If unable to pay or uninsured, contact:  [facility] or [facility] [place]. to become qualified for the adult dental clinic.  Patients with Medicaid: [facility] [phone] [address], [phone] [address], [phone]  If unable to pay, or uninsured, contact [facility] (620)[phone]) or [facility] Department [phone] in [place], [phone] in [facility]) to become qualified for the adult dental clinic   Other Low-Cost Community Dental Services: - [facility]- [address] [place], [place], [place], [address], [phone], [address]. [address], 2nd and 4th Thursday of the month at 6:30am.  10 clients each day by appointment, can sometimes see walk-in patients if someone does not show for an appointment. [facility]- [address] [place], [place], [address], [phone] - [facility]- [address], [place], [place], [address], [phone] - [place]- [phone] [facility]- [phone] [facility] Department- [phone]

## 2015-11-13 NOTE — ED Notes (Signed)
See PA note for secondary assessment.   

## 2016-11-06 ENCOUNTER — Emergency Department (HOSPITAL_COMMUNITY)
Admission: EM | Admit: 2016-11-06 | Discharge: 2016-11-06 | Disposition: A | Discharge status: Home / Self Care | Payer: Medicaid Other | Attending: Dermatology | Admitting: Dermatology

## 2016-11-06 ENCOUNTER — Encounter (HOSPITAL_COMMUNITY): Payer: Self-pay | Admitting: Emergency Medicine

## 2016-11-06 DIAGNOSIS — Y929 Unspecified place or not applicable: Secondary | ICD-10-CM | POA: Insufficient documentation

## 2016-11-06 DIAGNOSIS — Y999 Unspecified external cause status: Secondary | ICD-10-CM | POA: Insufficient documentation

## 2016-11-06 DIAGNOSIS — Z5321 Procedure and treatment not carried out due to patient leaving prior to being seen by health care provider: Secondary | ICD-10-CM | POA: Insufficient documentation

## 2016-11-06 DIAGNOSIS — S80812A Abrasion, left lower leg, initial encounter: Secondary | ICD-10-CM | POA: Insufficient documentation

## 2016-11-06 DIAGNOSIS — Y9389 Activity, other specified: Secondary | ICD-10-CM | POA: Insufficient documentation

## 2016-11-06 DIAGNOSIS — W268XXA Contact with other sharp object(s), not elsewhere classified, initial encounter: Secondary | ICD-10-CM | POA: Diagnosis not present

## 2016-11-06 DIAGNOSIS — S8992XA Unspecified injury of left lower leg, initial encounter: Secondary | ICD-10-CM | POA: Diagnosis present

## 2016-11-06 NOTE — ED Notes (Signed)
Patient called for xrays on multiple occasions without answer.

## 2016-11-06 NOTE — ED Triage Notes (Signed)
Pt. presents with left lower leg swelling/abrasions injured this evening while cutting a tree , a piece of heavy branch hit it , pain increases when ambulating .

## 2016-11-07 ENCOUNTER — Emergency Department (HOSPITAL_COMMUNITY)
Admission: EM | Admit: 2016-11-07 | Discharge: 2016-11-07 | Disposition: A | Discharge status: Home / Self Care | Payer: Medicaid Other | Attending: Emergency Medicine | Admitting: Emergency Medicine

## 2016-11-07 ENCOUNTER — Encounter (HOSPITAL_COMMUNITY): Payer: Self-pay

## 2016-11-07 ENCOUNTER — Emergency Department (HOSPITAL_COMMUNITY): Payer: Medicaid Other

## 2016-11-07 DIAGNOSIS — Y92009 Unspecified place in unspecified non-institutional (private) residence as the place of occurrence of the external cause: Secondary | ICD-10-CM | POA: Diagnosis not present

## 2016-11-07 DIAGNOSIS — Z79899 Other long term (current) drug therapy: Secondary | ICD-10-CM | POA: Insufficient documentation

## 2016-11-07 DIAGNOSIS — T148XXA Other injury of unspecified body region, initial encounter: Secondary | ICD-10-CM

## 2016-11-07 DIAGNOSIS — S80212A Abrasion, left knee, initial encounter: Secondary | ICD-10-CM | POA: Insufficient documentation

## 2016-11-07 DIAGNOSIS — W208XXA Other cause of strike by thrown, projected or falling object, initial encounter: Secondary | ICD-10-CM | POA: Diagnosis not present

## 2016-11-07 DIAGNOSIS — Y999 Unspecified external cause status: Secondary | ICD-10-CM | POA: Diagnosis not present

## 2016-11-07 DIAGNOSIS — F17200 Nicotine dependence, unspecified, uncomplicated: Secondary | ICD-10-CM | POA: Diagnosis not present

## 2016-11-07 DIAGNOSIS — Y939 Activity, unspecified: Secondary | ICD-10-CM | POA: Diagnosis not present

## 2016-11-07 DIAGNOSIS — M25562 Pain in left knee: Secondary | ICD-10-CM

## 2016-11-07 DIAGNOSIS — S8992XA Unspecified injury of left lower leg, initial encounter: Secondary | ICD-10-CM | POA: Diagnosis present

## 2016-11-07 MED ORDER — HYDROCODONE-ACETAMINOPHEN 5-325 MG PO TABS
1.0000 | ORAL_TABLET | Freq: Once | ORAL | Status: AC
  Administered 2016-11-07: 1 via ORAL
  Filled 2016-11-07: qty 1

## 2016-11-07 NOTE — ED Triage Notes (Signed)
Pt was cutting a tree down and a large limb fell and caught his left knee between two limbs, his left knee is swollen and he has several abrasions

## 2016-11-07 NOTE — ED Notes (Signed)
Bed: [facility] Expected date:  Expected time:  Means of arrival:  Comments: 
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
## 2016-11-07 NOTE — Discharge Instructions (Signed)
It was my pleasure taking care of you today!   Ibuprofen as needed for pain. Use crutches as needed for comfort. Ice and elevate knee throughout the day.  Please call the orthopedist listed if symptoms do not improve in one week to schedule follow up appointment.   Keep wound clean with mild soap and water. Keep area covered with a topical antibiotic ointment and bandage. Monitor area for signs of infection to include, but not limited to: increasing pain, spreading redness, drainage/pus, worsening swelling, or fevers.   Return to ER for new or worsening symptoms, any additional concerns.

## 2017-01-02 ENCOUNTER — Emergency Department (HOSPITAL_COMMUNITY)
Admission: EM | Admit: 2017-01-02 | Discharge: 2017-01-02 | Disposition: A | Discharge status: Home / Self Care | Payer: Medicaid Other | Attending: Emergency Medicine | Admitting: Emergency Medicine

## 2017-01-02 ENCOUNTER — Encounter (HOSPITAL_COMMUNITY): Payer: Self-pay | Admitting: Emergency Medicine

## 2017-01-02 DIAGNOSIS — F17200 Nicotine dependence, unspecified, uncomplicated: Secondary | ICD-10-CM | POA: Insufficient documentation

## 2017-01-02 DIAGNOSIS — Y999 Unspecified external cause status: Secondary | ICD-10-CM | POA: Diagnosis not present

## 2017-01-02 DIAGNOSIS — Y9389 Activity, other specified: Secondary | ICD-10-CM | POA: Insufficient documentation

## 2017-01-02 DIAGNOSIS — Z79899 Other long term (current) drug therapy: Secondary | ICD-10-CM | POA: Insufficient documentation

## 2017-01-02 DIAGNOSIS — Y929 Unspecified place or not applicable: Secondary | ICD-10-CM | POA: Insufficient documentation

## 2017-01-02 DIAGNOSIS — W228XXA Striking against or struck by other objects, initial encounter: Secondary | ICD-10-CM | POA: Insufficient documentation

## 2017-01-02 DIAGNOSIS — S0501XA Injury of conjunctiva and corneal abrasion without foreign body, right eye, initial encounter: Secondary | ICD-10-CM | POA: Insufficient documentation

## 2017-01-02 DIAGNOSIS — S0591XA Unspecified injury of right eye and orbit, initial encounter: Secondary | ICD-10-CM | POA: Diagnosis present

## 2017-01-02 MED ORDER — FLUORESCEIN SODIUM 0.6 MG OP STRP
1.0000 | ORAL_STRIP | Freq: Once | OPHTHALMIC | Status: AC
  Administered 2017-01-02: 1 via OPHTHALMIC
  Filled 2017-01-02: qty 1

## 2017-01-02 MED ORDER — ERYTHROMYCIN 5 MG/GM OP OINT
TOPICAL_OINTMENT | OPHTHALMIC | 0 refills | Status: DC
Start: 2017-01-02 — End: 2018-11-01

## 2017-01-02 MED ORDER — TETRACAINE HCL 0.5 % OP SOLN
1.0000 [drp] | Freq: Once | OPHTHALMIC | Status: AC
  Administered 2017-01-02: 1 [drp] via OPHTHALMIC
  Filled 2017-01-02: qty 4

## 2017-01-02 NOTE — Discharge Instructions (Signed)
Please call Dr. Sherryll BurgerShah (eye doctor) first thing tomorrow morning to schedule a follow up appointment.  [facility] with [facility] [address] [place], [place] [address] Local: [phone]) [phone] Toll-free: [phone] Fax: [phone](336) [phone]  Use topical antibiotic ointment as directed.  Return to ER for new or worsening symptoms, any additional concerns.

## 2017-01-02 NOTE — ED Triage Notes (Signed)
Per EMS, patient reports he was cutting wood and a piece got in his right eye. Denies visual changes.
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
## 2018-11-01 ENCOUNTER — Other Ambulatory Visit: Payer: Self-pay

## 2018-11-01 ENCOUNTER — Emergency Department (HOSPITAL_COMMUNITY)
Admission: EM | Admit: 2018-11-01 | Discharge: 2018-11-01 | Disposition: A | Discharge status: Home / Self Care | Payer: Medicaid Other | Attending: Emergency Medicine | Admitting: Emergency Medicine

## 2018-11-01 ENCOUNTER — Encounter (HOSPITAL_COMMUNITY): Payer: Self-pay | Admitting: Emergency Medicine

## 2018-11-01 ENCOUNTER — Emergency Department (HOSPITAL_COMMUNITY): Payer: Medicaid Other

## 2018-11-01 DIAGNOSIS — J181 Lobar pneumonia, unspecified organism: Secondary | ICD-10-CM | POA: Insufficient documentation

## 2018-11-01 DIAGNOSIS — F17210 Nicotine dependence, cigarettes, uncomplicated: Secondary | ICD-10-CM | POA: Insufficient documentation

## 2018-11-01 DIAGNOSIS — R0602 Shortness of breath: Secondary | ICD-10-CM | POA: Diagnosis present

## 2018-11-01 DIAGNOSIS — J189 Pneumonia, unspecified organism: Secondary | ICD-10-CM

## 2018-11-01 DIAGNOSIS — Z79899 Other long term (current) drug therapy: Secondary | ICD-10-CM | POA: Diagnosis not present

## 2018-11-01 LAB — BASIC METABOLIC PANEL
Anion gap: 10 (ref 5–15)
BUN: 9 mg/dL (ref 6–20)
CHLORIDE: 99 mmol/L (ref 98–111)
CO2: 23 mmol/L (ref 22–32)
Calcium: 8.8 mg/dL — ABNORMAL LOW (ref 8.9–10.3)
Creatinine, Ser: 0.86 mg/dL (ref 0.61–1.24)
GFR calc Af Amer: >60 mL/min (ref >60)
GFR calc non Af Amer: >60 mL/min (ref >60)
GLUCOSE: 107 mg/dL — AB (ref 70–99)
POTASSIUM: 4 mmol/L (ref 3.5–5.1)
Sodium: 132 mmol/L — ABNORMAL LOW (ref 135–145)

## 2018-11-01 LAB — CBC WITH DIFFERENTIAL/PLATELET
ABS IMMATURE GRANULOCYTES: 0.05 10*3/uL (ref 0.00–0.07)
BASOS ABS: 0 10*3/uL (ref 0.0–0.1)
Basophils Relative: 0 %
Eosinophils Absolute: 0.1 10*3/uL (ref 0.0–0.5)
Eosinophils Relative: 0 %
HEMATOCRIT: 40.2 % (ref 39.0–52.0)
Hemoglobin: 13.4 g/dL (ref 13.0–17.0)
IMMATURE GRANULOCYTES: 0 %
LYMPHS ABS: 1.4 10*3/uL (ref 0.7–4.0)
LYMPHS PCT: 11 %
MCH: 31.5 pg (ref 26.0–34.0)
MCHC: 33.3 g/dL (ref 30.0–36.0)
MCV: 94.4 fL (ref 80.0–100.0)
MONOS PCT: 7 %
Monocytes Absolute: 0.9 10*3/uL (ref 0.1–1.0)
NEUTROS ABS: 11.1 10*3/uL — AB (ref 1.7–7.7)
NEUTROS PCT: 82 %
NRBC: 0 % (ref 0.0–0.2)
Platelets: 228 10*3/uL (ref 150–400)
RBC: 4.26 MIL/uL (ref 4.22–5.81)
RDW: 11.9 % (ref 11.5–15.5)
WBC: 13.5 10*3/uL — ABNORMAL HIGH (ref 4.0–10.5)

## 2018-11-01 LAB — TROPONIN I: Troponin I: <0.03 ng/mL (ref <0.03)

## 2018-11-01 MED ORDER — ACETAMINOPHEN 325 MG PO TABS
650.0000 mg | ORAL_TABLET | Freq: Once | ORAL | Status: AC
  Administered 2018-11-01: 650 mg via ORAL
  Filled 2018-11-01: qty 2

## 2018-11-01 MED ORDER — DOXYCYCLINE HYCLATE 100 MG PO CAPS: 100.0000 mg | ORAL_CAPSULE | Freq: Two times a day (BID) | ORAL | 0 refills | Status: DC

## 2018-11-01 NOTE — Discharge Instructions (Addendum)
You have been diagnosed today with community-acquired pneumonia of the right lung.  At this time there does not appear to be the presence of an emergent medical condition, however there is always the potential for conditions to change. Please read and follow the below instructions.  Please return to the Emergency Department immediately for any new or worsening symptoms or if your symptoms do not improve within 3 days. Please be sure to follow up with your Primary Care Provider within one week regarding your visit today; please call their office to schedule an appointment even if you are feeling better for a follow-up visit. Please take your antibiotic doxycycline as prescribed for the next 10 days.  You may use over-the-counter Tylenol as directed on the packaging for your symptoms.  Please drink plenty of water and get plenty of rest to help with your illness. As we discussed current recommendations, testing for the new coronavirus is not indicated at this time.  It is still possible that you may have this illness so we strongly recommend that you self isolate for the next 2 weeks, follow the recommendations below regarding self-isolation and monitoring.  Return to the emergency department immediately for any new or worsening symptoms.  Get help right away if: You have worsening shortness of breath. You have increased chest pain. Your sickness becomes worse, especially if you are an older adult or have a weakened immune system. You cough up blood. New or concerning symptoms  Please read the additional information packets attached to your discharge summary.  Do not take your medicine if  develop an itchy rash, swelling in your mouth or lips, or difficulty breathing.  ------------------------  If you live with, or provide care at home for, a person confirmed to have, or being evaluated for, COVID-19 infection please follow these guidelines to prevent infection:  Follow healthcare providers  instructions Make sure that you understand and can help the patient follow any healthcare provider instructions for all care.  Provide for the patients basic needs You should help the patient with basic needs in the home and provide support for getting groceries, prescriptions, and other personal needs.  Monitor the patients symptoms If they are getting sicker, call his or her medical provider a  This will help the healthcare providers office take steps to keep other people from getting infected. Ask the healthcare provider to call the local or state health department.  Limit the number of people who have contact with the patient If possible, have only one caregiver for the patient. Other household members should stay in another home or place of residence. If this is not possible, they should stay in another room, or be separated from the patient as much as possible. Use a separate bathroom, if available. Restrict visitors who do not have an essential need to be in the home.  Keep older adults, very young children, and other sick people away from the patient Keep older adults, very young children, and those who have compromised immune systems or chronic health conditions away from the patient. This includes people with chronic heart, lung, or kidney conditions, diabetes, and cancer.  Ensure good ventilation Make sure that shared spaces in the home have good air flow, such as from an air conditioner or an opened window, weather permitting.  Wash your hands often Wash your hands often and thoroughly with soap and water for at least 20 seconds. You can use an alcohol based hand sanitizer if soap and water are not available  and if your hands are not visibly dirty. Avoid touching your eyes, nose, and mouth with unwashed hands. Use disposable paper towels to dry your hands. If not available, use dedicated cloth towels and replace them when they become wet.  Wear a facemask and gloves Wear  a disposable facemask at all times in the room and gloves when you touch or have contact with the patients blood, body fluids, and/or secretions or excretions, such as sweat, saliva, sputum, nasal mucus, vomit, urine, or feces.  Ensure the mask fits over your nose and mouth tightly, and do not touch it during use. Throw out disposable facemasks and gloves after using them. Do not reuse. Wash your hands immediately after removing your facemask and gloves. If your personal clothing becomes contaminated, carefully remove clothing and launder. Wash your hands after handling contaminated clothing. Place all used disposable facemasks, gloves, and other waste in a lined container before disposing them with other household waste. Remove gloves and wash your hands immediately after handling these items.  Do not share dishes, glasses, or other household items with the patient Avoid sharing household items. You should not share dishes, drinking glasses, cups, eating utensils, towels, bedding, or other items After the person uses these items, you should wash them thoroughly with soap and water.  Wash laundry thoroughly Immediately remove and wash clothes or bedding that have blood, body fluids, and/or secretions or excretions, such as sweat, saliva, sputum, nasal mucus, vomit, urine, or feces, on them. Wear gloves when handling laundry from the patient. Read and follow directions on labels of laundry or clothing items and detergent. In general, wash and dry with the warmest temperatures recommended on the label.  Clean all areas the individual has used often Clean all touchable surfaces, such as counters, tabletops, doorknobs, bathroom fixtures, toilets, phones, keyboards, tablets, and bedside tables, every day. Also, clean any surfaces that may have blood, body fluids, and/or secretions or excretions on them. Wear gloves when cleaning surfaces the patient has come in contact with. Use a diluted bleach  solution (e.g., dilute bleach with 1 part bleach and 10 parts water) or a household disinfectant with a label that says EPA-registered for coronaviruses. To make a bleach solution at home, add 1 tablespoon of bleach to 1 quart (4 cups) of water. For a larger supply, add  cup of bleach to 1 gallon (16 cups) of water. Read labels of cleaning products and follow recommendations provided on product labels. Labels contain instructions for safe and effective use of the cleaning product including precautions you should take when applying the product, such as wearing gloves or eye protection and making sure you have good ventilation during use of the product. Remove gloves and wash hands immediately after cleaning.  Monitor yourself for signs and symptoms of illness Caregivers and household members are considered close contacts, should monitor their health, and will be asked to limit movement outside of the home to the extent possible. Follow the monitoring steps for close contacts listed on the symptom monitoring form.   ? If you have additional questions, contact your local health department or call the epidemiologist on call at [phone] (available 24/7). ? This guidance is subject to change. For the most up-to-date guidance from [facility], please refer to their website: [email or web address]hu
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
## 2018-11-01 NOTE — ED Notes (Signed)
Discharge instructions and prescription discussed with Pt. Pt verbalized understanding. Pt stable and ambulatory.  Pt aware he needs to self isolate x 2 weeks.

## 2018-11-01 NOTE — ED Notes (Signed)
Radiology at bedside

## 2018-11-01 NOTE — ED Triage Notes (Addendum)
Pt reports SOB and r side chest pain x1 hour, with productive cough and seasonal allergy symptoms x4 days.

## 2018-11-08 NOTE — Congregational Nurse Program (Signed)
Pt did not voice any physical concerns at today's visit. No signs of distress observed today.

## 2019-03-11 ENCOUNTER — Encounter: Payer: Self-pay | Admitting: Pediatric Intensive Care

## 2019-03-12 NOTE — Congregational Nurse Program (Signed)
  Dept: [facility] Nurse Program Note  Date of Encounter: 03/11/2019  Past Medical History: Past Medical History:  Diagnosis Date  . Dental caries noted on examination     Encounter Details:Client seeks referral to [facility] clinic for suboxone therapy.  CN will refer to Freddy Finner. Lisette Abu RN BSN [phone]

## 2019-03-17 ENCOUNTER — Telehealth: Payer: Self-pay | Admitting: *Deleted

## 2019-03-17 NOTE — Telephone Encounter (Signed)
After calling a few times made contact this pm, will rtc at 1100 8/12 and finish initial screening for oud

## 2019-03-17 NOTE — Telephone Encounter (Signed)
-----   Message from Tami Lin, RN sent at 03/12/2019  5:05 PM EDT ----- Regarding: Suboxone referral [place]-  This client is requesting a referral for treatment. I'll update his phone in demographics. He said it was OK for you to call him directly.  Best,  [place]

## 2019-05-25 ENCOUNTER — Emergency Department (HOSPITAL_COMMUNITY)
Admission: EM | Admit: 2019-05-25 | Discharge: 2019-05-25 | Discharge status: Against Medical Advice | Payer: Medicaid Other

## 2019-05-25 NOTE — ED Notes (Signed)
No answer for triage x2 

## 2019-05-28 ENCOUNTER — Emergency Department (HOSPITAL_COMMUNITY)
Admission: EM | Admit: 2019-05-28 | Discharge: 2019-05-28 | Disposition: A | Discharge status: Home / Self Care | Payer: Medicaid Other | Attending: Emergency Medicine | Admitting: Emergency Medicine

## 2019-05-28 ENCOUNTER — Encounter (HOSPITAL_COMMUNITY): Payer: Self-pay | Admitting: Emergency Medicine

## 2019-05-28 ENCOUNTER — Other Ambulatory Visit: Payer: Self-pay

## 2019-05-28 ENCOUNTER — Emergency Department (HOSPITAL_COMMUNITY): Payer: Medicaid Other

## 2019-05-28 DIAGNOSIS — K8020 Calculus of gallbladder without cholecystitis without obstruction: Secondary | ICD-10-CM | POA: Insufficient documentation

## 2019-05-28 DIAGNOSIS — R1013 Epigastric pain: Secondary | ICD-10-CM

## 2019-05-28 DIAGNOSIS — K8050 Calculus of bile duct without cholangitis or cholecystitis without obstruction: Secondary | ICD-10-CM | POA: Diagnosis not present

## 2019-05-28 DIAGNOSIS — F17210 Nicotine dependence, cigarettes, uncomplicated: Secondary | ICD-10-CM | POA: Insufficient documentation

## 2019-05-28 DIAGNOSIS — Z79899 Other long term (current) drug therapy: Secondary | ICD-10-CM | POA: Diagnosis not present

## 2019-05-28 LAB — LIPASE, BLOOD: Lipase: 38 U/L (ref 11–51)

## 2019-05-28 LAB — CBC
HCT: 41.5 % (ref 39.0–52.0)
Hemoglobin: 14.7 g/dL (ref 13.0–17.0)
MCH: 33 pg (ref 26.0–34.0)
MCHC: 35.4 g/dL (ref 30.0–36.0)
MCV: 93.3 fL (ref 80.0–100.0)
Platelets: 325 10*3/uL (ref 150–400)
RBC: 4.45 MIL/uL (ref 4.22–5.81)
RDW: 12.5 % (ref 11.5–15.5)
WBC: 9.3 10*3/uL (ref 4.0–10.5)
nRBC: 0 % (ref 0.0–0.2)

## 2019-05-28 LAB — URINALYSIS, ROUTINE W REFLEX MICROSCOPIC
Bilirubin Urine: NEGATIVE
Glucose, UA: NEGATIVE mg/dL
Hgb urine dipstick: NEGATIVE
Ketones, ur: NEGATIVE mg/dL
Leukocytes,Ua: NEGATIVE
Nitrite: NEGATIVE
Protein, ur: NEGATIVE mg/dL
Specific Gravity, Urine: 1.011 (ref 1.005–1.030)
pH: 6 (ref 5.0–8.0)

## 2019-05-28 LAB — COMPREHENSIVE METABOLIC PANEL
ALT: 14 U/L (ref 0–44)
AST: 21 U/L (ref 15–41)
Albumin: 4.8 g/dL (ref 3.5–5.0)
Alkaline Phosphatase: 58 U/L (ref 38–126)
Anion gap: 10 (ref 5–15)
BUN: 12 mg/dL (ref 6–20)
CO2: 24 mmol/L (ref 22–32)
Calcium: 9.6 mg/dL (ref 8.9–10.3)
Chloride: 106 mmol/L (ref 98–111)
Creatinine, Ser: 1.07 mg/dL (ref 0.61–1.24)
GFR calc Af Amer: >60 mL/min (ref >60)
GFR calc non Af Amer: >60 mL/min (ref >60)
Glucose, Bld: 127 mg/dL — ABNORMAL HIGH (ref 70–99)
Potassium: 3.3 mmol/L — ABNORMAL LOW (ref 3.5–5.1)
Sodium: 140 mmol/L (ref 135–145)
Total Bilirubin: 1.6 mg/dL — ABNORMAL HIGH (ref 0.3–1.2)
Total Protein: 7.6 g/dL (ref 6.5–8.1)

## 2019-05-28 MED ORDER — MORPHINE SULFATE (PF) 4 MG/ML IV SOLN
4.0000 mg | Freq: Once | INTRAVENOUS | Status: AC
  Administered 2019-05-28: 4 mg via INTRAVENOUS
  Filled 2019-05-28: qty 1

## 2019-05-28 MED ORDER — SODIUM CHLORIDE 0.9% FLUSH
3.0000 mL | Freq: Once | INTRAVENOUS | Status: AC
  Administered 2019-05-28: 10:00:00 3 mL via INTRAVENOUS

## 2019-05-28 MED ORDER — HYDROMORPHONE HCL 1 MG/ML IJ SOLN
1.0000 mg | Freq: Once | INTRAMUSCULAR | Status: AC
  Administered 2019-05-28: 1 mg via INTRAVENOUS
  Filled 2019-05-28: qty 1

## 2019-05-28 MED ORDER — IOHEXOL 300 MG/ML  SOLN
100.0000 mL | Freq: Once | INTRAMUSCULAR | Status: AC | PRN
  Administered 2019-05-28: 100 mL via INTRAVENOUS

## 2019-05-28 MED ORDER — ONDANSETRON HCL 4 MG/2ML IJ SOLN
4.0000 mg | Freq: Once | INTRAMUSCULAR | Status: AC
  Administered 2019-05-28: 10:00:00 4 mg via INTRAVENOUS
  Filled 2019-05-28: qty 2

## 2019-05-28 NOTE — ED Notes (Signed)
Pt states he was diagnosed with gall stones about a year ago.

## 2019-05-28 NOTE — ED Triage Notes (Signed)
Pt reports abd pain in the mid umbilicus area for the past year. Pt denies seeking medical treatment until this date.

## 2019-05-28 NOTE — ED Notes (Signed)
Pt ambulatory to restroom with steady gait.

## 2019-05-28 NOTE — Discharge Instructions (Addendum)
You have been seen today for abdominal pain. Please read and follow all provided instructions. Return to the emergency room for worsening condition or new concerning symptoms.    The [place] today shows you have gall stones in your gall bladder but there is no current infection.  1. Medications:  Prescription  Continue usual home medications Take medications as prescribed. Please review all of the medicines and only take them if you do not have an allergy to them.   2. Treatment: rest, drink plenty of fluids  3. Follow Up: Please follow up with the surgery clinic. The information is provided for you in this paperwork. Call today or tomorrow to schedule a follow up appointment.  ?

## 2019-05-28 NOTE — ED Notes (Signed)
Patient verbalizes understanding of discharge instructions . Opportunity for questions and answers were provided . Armband removed by staff ,Pt discharged from ED. W/C  offered at D/C  and Declined W/C at D/C and was escorted to lobby by RN.  
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
## 2019-11-27 ENCOUNTER — Encounter: Payer: Self-pay | Admitting: *Deleted

## 2019-11-27 DIAGNOSIS — L03114 Cellulitis of left upper limb: Secondary | ICD-10-CM | POA: Diagnosis present

## 2019-12-28 ENCOUNTER — Other Ambulatory Visit: Payer: Self-pay

## 2019-12-28 ENCOUNTER — Emergency Department (HOSPITAL_COMMUNITY)
Admission: EM | Admit: 2019-12-28 | Discharge: 2019-12-28 | Disposition: A | Discharge status: Home / Self Care | Payer: Medicaid Other | Attending: Emergency Medicine | Admitting: Emergency Medicine

## 2019-12-28 DIAGNOSIS — Z5321 Procedure and treatment not carried out due to patient leaving prior to being seen by health care provider: Secondary | ICD-10-CM | POA: Insufficient documentation

## 2019-12-28 DIAGNOSIS — M549 Dorsalgia, unspecified: Secondary | ICD-10-CM | POA: Diagnosis not present

## 2019-12-28 DIAGNOSIS — R300 Dysuria: Secondary | ICD-10-CM | POA: Insufficient documentation

## 2019-12-28 LAB — URINALYSIS, ROUTINE W REFLEX MICROSCOPIC
Bilirubin Urine: NEGATIVE
Glucose, UA: NEGATIVE mg/dL
Hgb urine dipstick: NEGATIVE
Ketones, ur: NEGATIVE mg/dL
Leukocytes,Ua: NEGATIVE
Nitrite: NEGATIVE
Protein, ur: NEGATIVE mg/dL
Specific Gravity, Urine: 1.029 (ref 1.005–1.030)
pH: 5 (ref 5.0–8.0)

## 2019-12-28 NOTE — ED Triage Notes (Signed)
Per pt he has been having back pain x2 days. Pt said hurts when he stands and also when he pees. Pt has not had any injuries to himself. Pt said does not burn with urination.

## 2020-01-05 ENCOUNTER — Emergency Department (HOSPITAL_COMMUNITY)
Admission: EM | Admit: 2020-01-05 | Discharge: 2020-01-06 | Disposition: A | Discharge status: Home / Self Care | Payer: Medicaid Other | Attending: Emergency Medicine | Admitting: Emergency Medicine

## 2020-01-05 ENCOUNTER — Other Ambulatory Visit: Payer: Self-pay

## 2020-01-05 ENCOUNTER — Encounter (HOSPITAL_COMMUNITY): Payer: Self-pay

## 2020-01-05 DIAGNOSIS — R1032 Left lower quadrant pain: Secondary | ICD-10-CM | POA: Insufficient documentation

## 2020-01-05 DIAGNOSIS — Z5321 Procedure and treatment not carried out due to patient leaving prior to being seen by health care provider: Secondary | ICD-10-CM | POA: Diagnosis not present

## 2020-01-05 LAB — URINALYSIS, ROUTINE W REFLEX MICROSCOPIC
Bilirubin Urine: NEGATIVE
Glucose, UA: NEGATIVE mg/dL
Hgb urine dipstick: NEGATIVE
Ketones, ur: NEGATIVE mg/dL
Leukocytes,Ua: NEGATIVE
Nitrite: NEGATIVE
Protein, ur: NEGATIVE mg/dL
Specific Gravity, Urine: 1.019 (ref 1.005–1.030)
pH: 5 (ref 5.0–8.0)

## 2020-01-05 NOTE — ED Triage Notes (Signed)
Patient arrived with complaints of left sided flank pain that started about a week and a half ago. Patient declines any pain with urination or any recent falls/injury. Taking ibuprofen at home for pain.

## 2020-01-06 NOTE — ED Notes (Signed)
Not seen in lobby for recheck for V/S

## 2020-10-21 IMAGING — CT CT ABD-PELV W/ CM
2 of 4 series · 16 of 46 positions shown, 18 images · IV contrast (Omni 300)
Comparison: None

CLINICAL DATA: Upper abdominal pain and epigastric pain for 1 year,
nausea, smoker

EXAM:
CT ABDOMEN AND PELVIS WITH CONTRAST
TECHNIQUE: Multidetector CT imaging of the abdomen and pelvis was performed
using the standard protocol following bolus administration of
intravenous contrast. Sagittal and coronal MPR images reconstructed
from axial data set.
CONTRAST:  100mL OMNIPAQUE IOHEXOL 300 MG/ML SOLN IV. No oral
contrast.

[Series 3: a/p w/ 5mm · axial · 0.75mm/px · z∈[+774,+1194]mm · 13 of 93 slices shown, 15 images]
[im 5/93  soft-tissue]
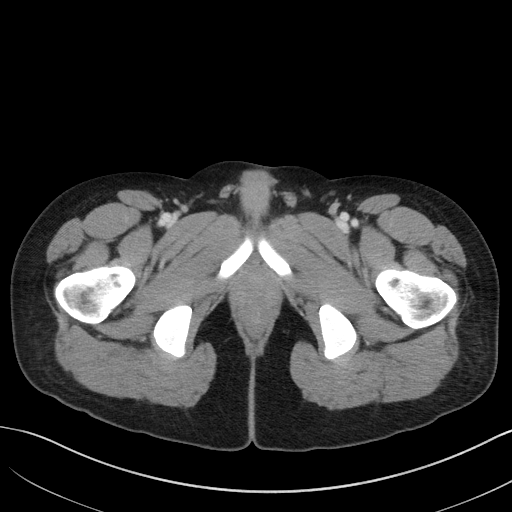
[im 5/93  bone]
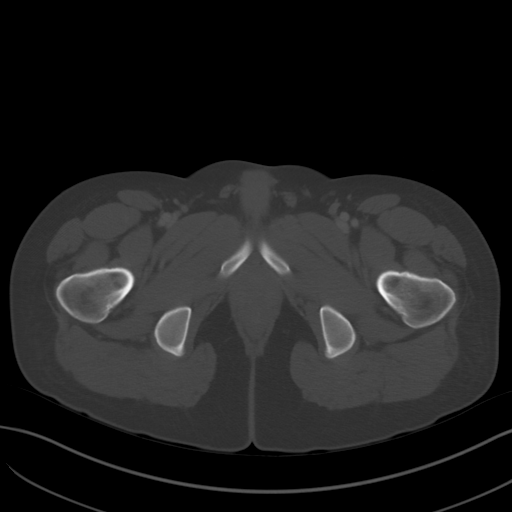
[im 13/93  soft-tissue]
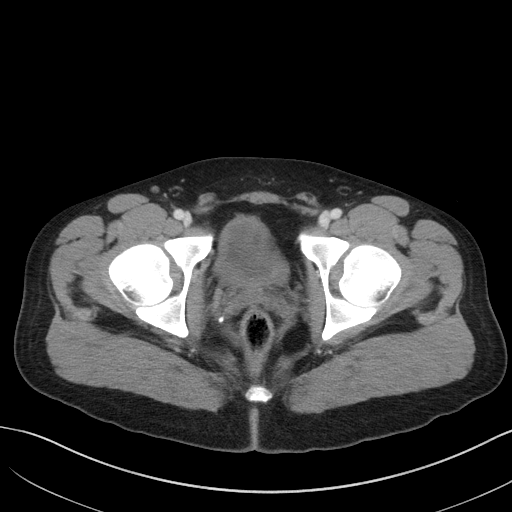
[im 21/93  soft-tissue]
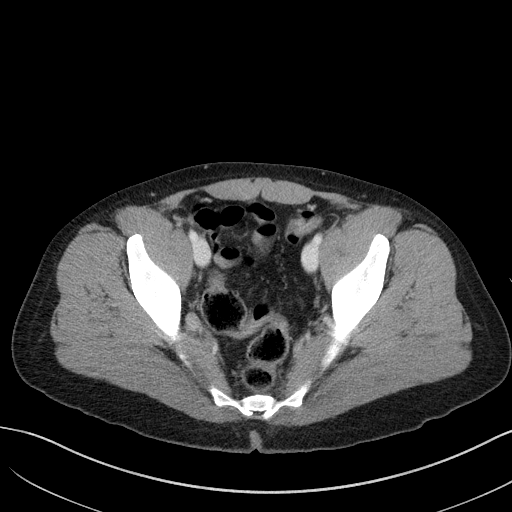
[im 25/93  soft-tissue]
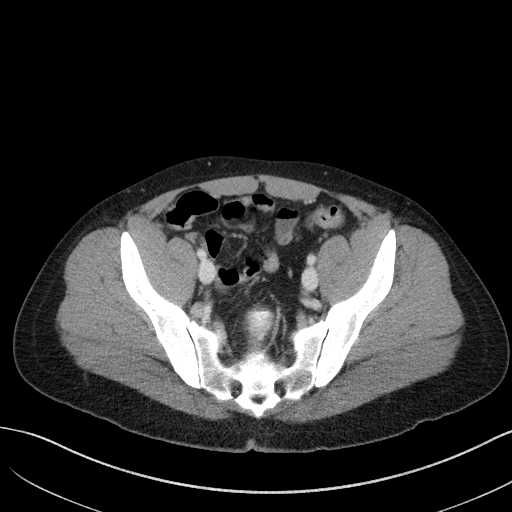
[im 33/93  soft-tissue]
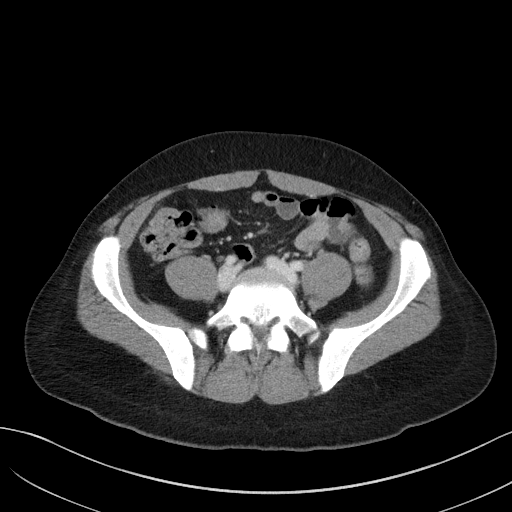
[im 41/93  soft-tissue]
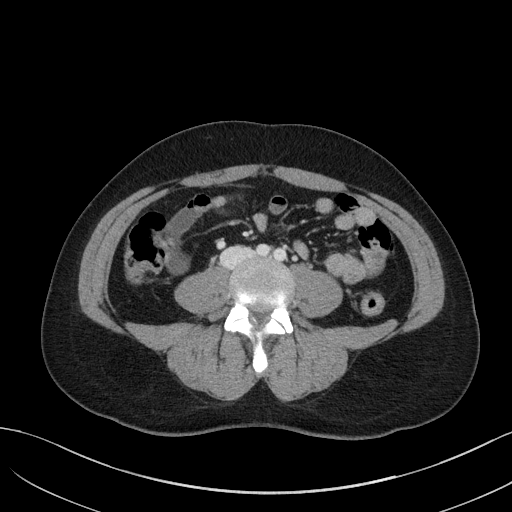
[im 49/93  soft-tissue]
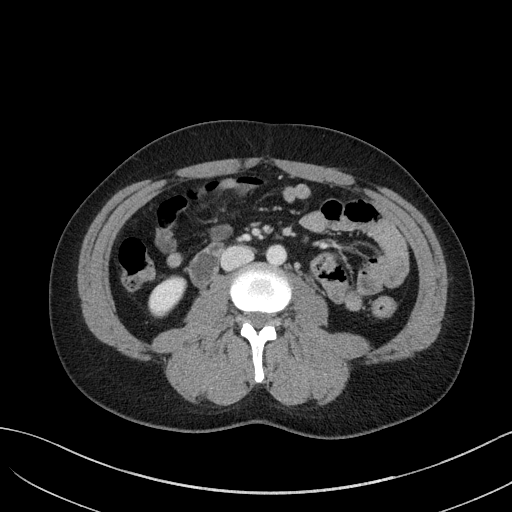
[im 53/93  soft-tissue]
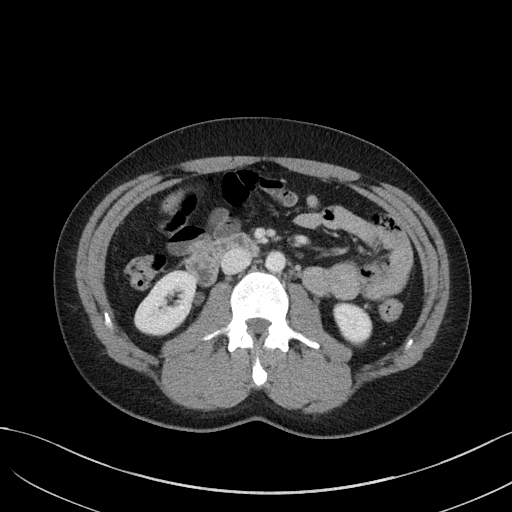
[im 61/93  soft-tissue]
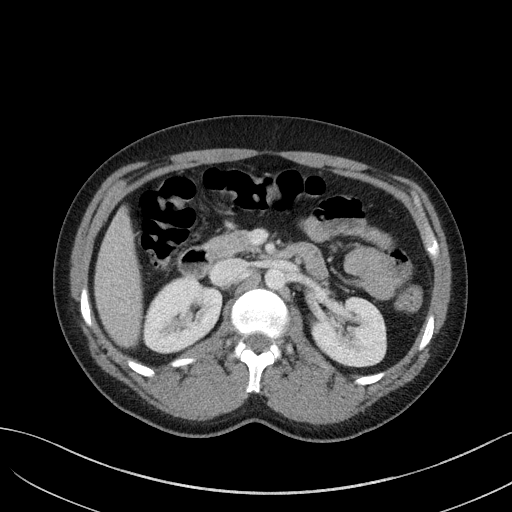
[im 61/93  bone]
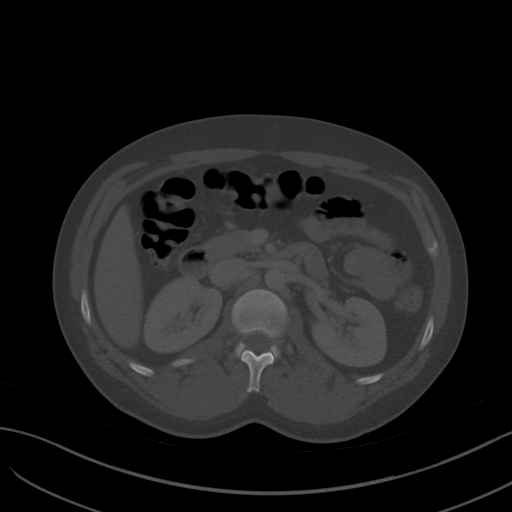
[im 69/93  soft-tissue]
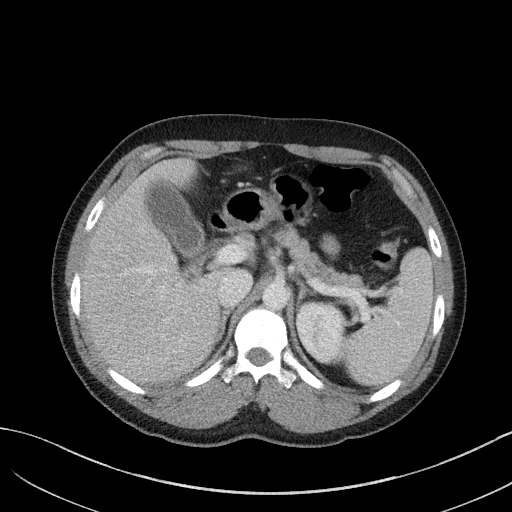
[im 73/93  soft-tissue]
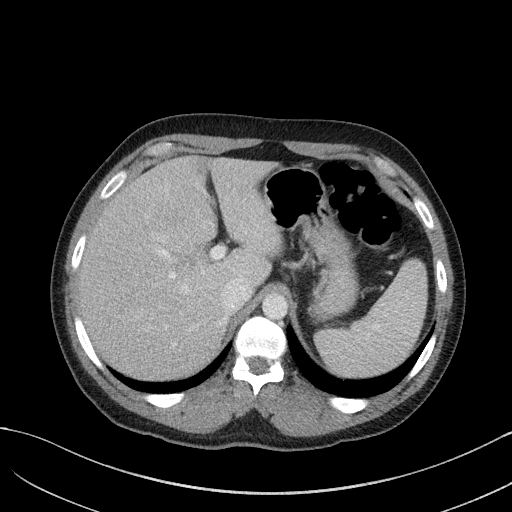
[im 81/93  soft-tissue]
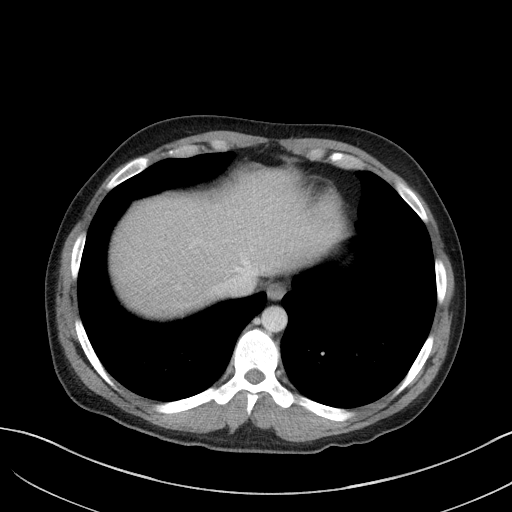
[im 89/93  soft-tissue]
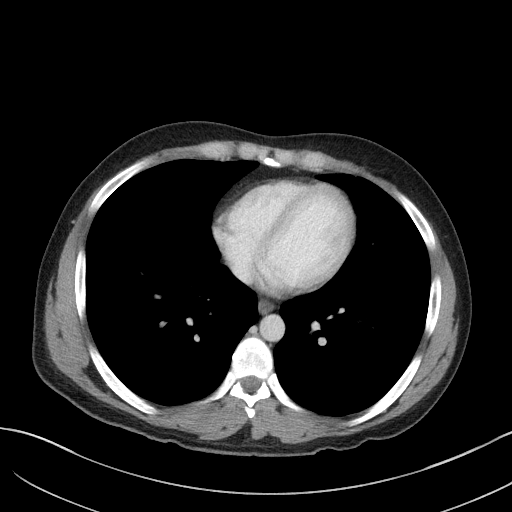

[Series 6: a/p w/ cor · coronal · 0.65mm/px · 3 of 151 slices shown]
[im 51/151  soft-tissue]
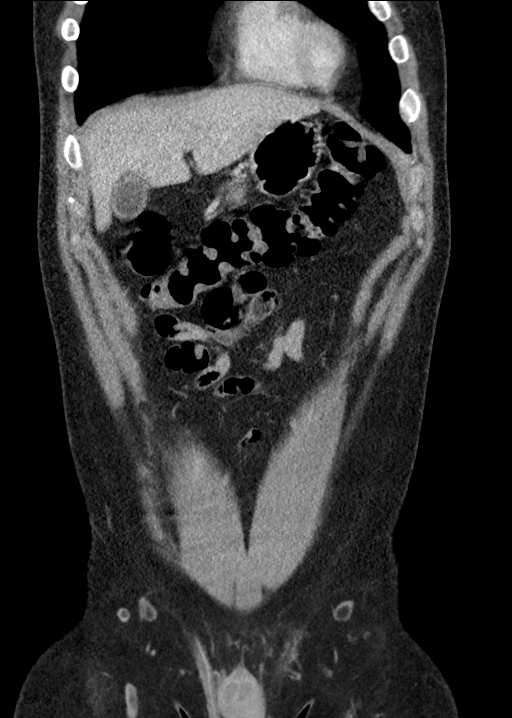
[im 67/151  soft-tissue]
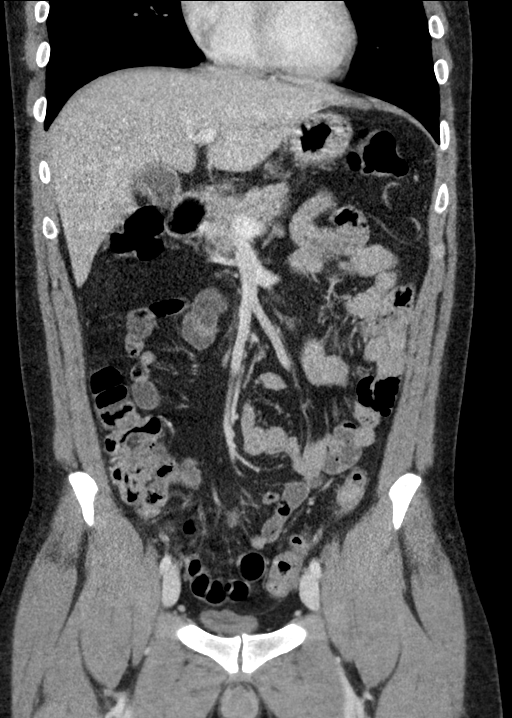
[im 84/151  soft-tissue]
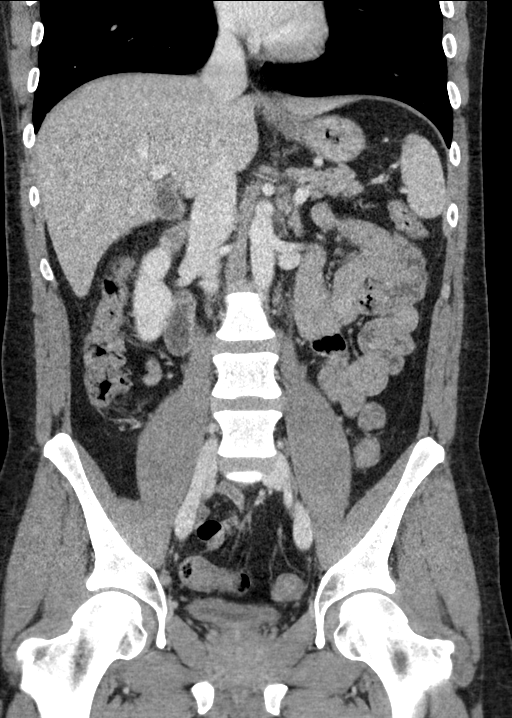

[16 of 46 positions shown; findings below may reference images not displayed]

FINDINGS: Lower chest: Lung bases clear

Hepatobiliary: Mild gallbladder wall thickening with minimally hazy
pericholecystic margins concerning for acute cholecystitis. No
gallbladder calcifications or biliary dilatation. Liver normal
appearance.

Pancreas: Normal appearance

Spleen: Normal appearance

Adrenals/Urinary Tract: Adrenal glands, kidneys, ureters, and
bladder normal appearance

Stomach/Bowel: Normal appendix. Stomach and bowel loops normal
appearance

Vascular/Lymphatic: Vascular structures patent. No adenopathy.

Reproductive: Unremarkable prostate gland and seminal vesicles

Other: No free air or free fluid. No definite hernia.

Musculoskeletal: Normal appearance
IMPRESSION: Mild gallbladder wall thickening with minimally hazy pericholecystic
margins concerning for acute cholecystitis.

Further RIGHT evaluation by RIGHT upper quadrant ultrasound
recommended.

Remainder of exam unremarkable.

## 2024-03-13 ENCOUNTER — Inpatient Hospital Stay (HOSPITAL_COMMUNITY)
Admission: EM | Admit: 2024-03-13 | Discharge: 2024-03-16 | DRG: 571 | Discharge status: Against Medical Advice | Attending: Internal Medicine | Admitting: Internal Medicine

## 2024-03-13 ENCOUNTER — Emergency Department (HOSPITAL_COMMUNITY)

## 2024-03-13 ENCOUNTER — Other Ambulatory Visit: Payer: Self-pay

## 2024-03-13 ENCOUNTER — Encounter (HOSPITAL_COMMUNITY): Payer: Self-pay

## 2024-03-13 DIAGNOSIS — L03114 Cellulitis of left upper limb: Secondary | ICD-10-CM | POA: Diagnosis present

## 2024-03-13 DIAGNOSIS — S60552A Superficial foreign body of left hand, initial encounter: Secondary | ICD-10-CM | POA: Diagnosis present

## 2024-03-13 DIAGNOSIS — E871 Hypo-osmolality and hyponatremia: Secondary | ICD-10-CM | POA: Diagnosis present

## 2024-03-13 DIAGNOSIS — L03113 Cellulitis of right upper limb: Secondary | ICD-10-CM | POA: Diagnosis present

## 2024-03-13 DIAGNOSIS — L0291 Cutaneous abscess, unspecified: Principal | ICD-10-CM

## 2024-03-13 DIAGNOSIS — R739 Hyperglycemia, unspecified: Secondary | ICD-10-CM | POA: Diagnosis present

## 2024-03-13 DIAGNOSIS — F17200 Nicotine dependence, unspecified, uncomplicated: Secondary | ICD-10-CM | POA: Diagnosis present

## 2024-03-13 DIAGNOSIS — L03119 Cellulitis of unspecified part of limb: Secondary | ICD-10-CM

## 2024-03-13 DIAGNOSIS — L02419 Cutaneous abscess of limb, unspecified: Secondary | ICD-10-CM | POA: Diagnosis not present

## 2024-03-13 DIAGNOSIS — L02413 Cutaneous abscess of right upper limb: Secondary | ICD-10-CM | POA: Diagnosis present

## 2024-03-13 DIAGNOSIS — R0789 Other chest pain: Secondary | ICD-10-CM | POA: Diagnosis present

## 2024-03-13 DIAGNOSIS — Z5329 Procedure and treatment not carried out because of patient's decision for other reasons: Secondary | ICD-10-CM | POA: Diagnosis not present

## 2024-03-13 DIAGNOSIS — L02512 Cutaneous abscess of left hand: Principal | ICD-10-CM | POA: Diagnosis present

## 2024-03-13 DIAGNOSIS — F1120 Opioid dependence, uncomplicated: Secondary | ICD-10-CM | POA: Diagnosis present

## 2024-03-13 DIAGNOSIS — F1910 Other psychoactive substance abuse, uncomplicated: Secondary | ICD-10-CM | POA: Diagnosis present

## 2024-03-13 DIAGNOSIS — L089 Local infection of the skin and subcutaneous tissue, unspecified: Secondary | ICD-10-CM | POA: Diagnosis not present

## 2024-03-13 LAB — COMPREHENSIVE METABOLIC PANEL WITH GFR
ALT: 22 U/L (ref 0–44)
AST: 21 U/L (ref 15–41)
Albumin: 2.8 g/dL — ABNORMAL LOW (ref 3.5–5.0)
Alkaline Phosphatase: 65 U/L (ref 38–126)
Anion gap: 12 (ref 5–15)
BUN: 10 mg/dL (ref 6–20)
CO2: 20 mmol/L — ABNORMAL LOW (ref 22–32)
Calcium: 8.3 mg/dL — ABNORMAL LOW (ref 8.9–10.3)
Chloride: 98 mmol/L (ref 98–111)
Creatinine, Ser: 0.85 mg/dL (ref 0.61–1.24)
GFR, Estimated: >60 mL/min (ref >60)
Glucose, Bld: 112 mg/dL — ABNORMAL HIGH (ref 70–99)
Potassium: 3.8 mmol/L (ref 3.5–5.1)
Sodium: 130 mmol/L — ABNORMAL LOW (ref 135–145)
Total Bilirubin: 1.3 mg/dL — ABNORMAL HIGH (ref 0.0–1.2)
Total Protein: 6.6 g/dL (ref 6.5–8.1)

## 2024-03-13 LAB — CBC WITH DIFFERENTIAL/PLATELET
Abs Immature Granulocytes: 0.15 K/uL — ABNORMAL HIGH (ref 0.00–0.07)
Basophils Absolute: 0 K/uL (ref 0.0–0.1)
Basophils Relative: 0 %
Eosinophils Absolute: 0 K/uL (ref 0.0–0.5)
Eosinophils Relative: 0 %
HCT: 33.8 % — ABNORMAL LOW (ref 39.0–52.0)
Hemoglobin: 11.3 g/dL — ABNORMAL LOW (ref 13.0–17.0)
Immature Granulocytes: 1 %
Lymphocytes Relative: 9 %
Lymphs Abs: 1.6 K/uL (ref 0.7–4.0)
MCH: 27.2 pg (ref 26.0–34.0)
MCHC: 33.4 g/dL (ref 30.0–36.0)
MCV: 81.3 fL (ref 80.0–100.0)
Monocytes Absolute: 1.9 K/uL — ABNORMAL HIGH (ref 0.1–1.0)
Monocytes Relative: 10 %
Neutro Abs: 14.8 K/uL — ABNORMAL HIGH (ref 1.7–7.7)
Neutrophils Relative %: 80 %
Platelets: 190 K/uL (ref 150–400)
RBC: 4.16 MIL/uL — ABNORMAL LOW (ref 4.22–5.81)
RDW: 13.7 % (ref 11.5–15.5)
WBC: 18.4 K/uL — ABNORMAL HIGH (ref 4.0–10.5)
nRBC: 0 % (ref 0.0–0.2)

## 2024-03-13 LAB — I-STAT CG4 LACTIC ACID, ED: Lactic Acid, Venous: 1.2 mmol/L (ref 0.5–1.9)

## 2024-03-13 LAB — TROPONIN I (HIGH SENSITIVITY)
Troponin I (High Sensitivity): 6 ng/L (ref <18)
Troponin I (High Sensitivity): 7 ng/L (ref <18)

## 2024-03-13 MED ORDER — ENOXAPARIN SODIUM 40 MG/0.4ML IJ SOSY
40.0000 mg | PREFILLED_SYRINGE | INTRAMUSCULAR | Status: DC
  Administered 2024-03-13: 40 mg via SUBCUTANEOUS
  Filled 2024-03-13 (×2): qty 0.4

## 2024-03-13 MED ORDER — HYDROMORPHONE HCL 1 MG/ML IJ SOLN
0.5000 mg | INTRAMUSCULAR | Status: DC | PRN
  Administered 2024-03-13: 0.5 mg via INTRAVENOUS
  Administered 2024-03-14 – 2024-03-15 (×8): 1 mg via INTRAVENOUS
  Filled 2024-03-13 (×9): qty 1

## 2024-03-13 MED ORDER — ACETAMINOPHEN 325 MG PO TABS
650.0000 mg | ORAL_TABLET | Freq: Once | ORAL | Status: AC
  Administered 2024-03-13: 650 mg via ORAL
  Filled 2024-03-13: qty 2

## 2024-03-13 MED ORDER — SODIUM CHLORIDE 0.9 % IV SOLN: INTRAVENOUS | Status: AC

## 2024-03-13 MED ORDER — ACETAMINOPHEN 650 MG RE SUPP
650.0000 mg | Freq: Four times a day (QID) | RECTAL | Status: DC | PRN
  Filled 2024-03-13: qty 1

## 2024-03-13 MED ORDER — VANCOMYCIN HCL IN DEXTROSE 1-5 GM/200ML-% IV SOLN
1000.0000 mg | Freq: Once | INTRAVENOUS | Status: AC
  Administered 2024-03-13: 1000 mg via INTRAVENOUS
  Filled 2024-03-13: qty 200

## 2024-03-13 MED ORDER — ACETAMINOPHEN 325 MG PO TABS
650.0000 mg | ORAL_TABLET | Freq: Four times a day (QID) | ORAL | Status: DC | PRN
Start: 2024-03-13 — End: 2024-03-16
  Administered 2024-03-14 (×2): 650 mg via ORAL
  Filled 2024-03-13 (×3): qty 2

## 2024-03-13 MED ORDER — VANCOMYCIN HCL 1250 MG/250ML IV SOLN
1250.0000 mg | Freq: Two times a day (BID) | INTRAVENOUS | Status: DC
  Administered 2024-03-14 – 2024-03-15 (×3): 1250 mg via INTRAVENOUS
  Filled 2024-03-13 (×7): qty 250

## 2024-03-13 MED ORDER — POLYETHYLENE GLYCOL 3350 17 G PO PACK: 17.0000 g | PACK | Freq: Every day | ORAL | Status: DC | PRN

## 2024-03-13 MED ORDER — ONDANSETRON HCL 4 MG/2ML IJ SOLN: 4.0000 mg | Freq: Four times a day (QID) | INTRAMUSCULAR | Status: DC | PRN

## 2024-03-13 MED ORDER — ONDANSETRON HCL 4 MG PO TABS: 4.0000 mg | ORAL_TABLET | Freq: Four times a day (QID) | ORAL | Status: DC | PRN

## 2024-03-13 MED ORDER — OXYCODONE HCL 5 MG PO TABS
5.0000 mg | ORAL_TABLET | ORAL | Status: DC | PRN
  Administered 2024-03-14 – 2024-03-15 (×6): 5 mg via ORAL
  Filled 2024-03-13 (×6): qty 1
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
## 2024-03-13 NOTE — ED Provider Notes (Signed)
 [facility] Provider Note   CSN: [id number] Arrival date & time: 03/13/24  1432     Patient presents with: Abscess   Daniel Bentley is a 33 y.o. male with past medical history of IVDU, tobacco use presents to emergency department for evaluation of wound on right hand, right elbow, left hand for past two weeks.  Reports that these have been present for a week and may be related to injection of fentanyl     Abscess      Prior to Admission medications   Not on File    Allergies: Patient has no known allergies.    Review of Systems  Skin:  Positive for wound.    Updated Vital Signs BP 102/84 (BP Location: Left Arm)   Pulse 98   Temp 99.9 F (37.7 C) (Oral)   Resp 18   Ht 5' 6 (1.676 m)   Wt 68 kg   SpO2 95%   BMI 24.21 kg/m   Physical Exam Vitals and nursing note reviewed.  Constitutional:      General: He is not in acute distress.    Appearance: Normal appearance.  HENT:     Head: Normocephalic and atraumatic.  Eyes:     Conjunctiva/sclera: Conjunctivae normal.  Cardiovascular:     Rate and Rhythm: Normal rate.  Pulmonary:     Effort: Pulmonary effort is normal. No respiratory distress.  Skin:    Coloration: Skin is not jaundiced or pale.     Comments: Purulent foul smelling wounds with surrounding erythema, warmth. See media  Neurological:     Mental Status: He is alert. Mental status is at baseline.    Left dorsal hand   Right medial aspect of anterior upper forearm   Right medial aspect of posterior forearm   Right lateral aspect of posterior forearm  (all labs ordered are listed, but only abnormal results are displayed) Labs Reviewed  COMPREHENSIVE METABOLIC PANEL WITH GFR - Abnormal; Notable for the following components:      Result Value   Sodium 130 (*)    CO2 20 (*)    Glucose, Bld 112 (*)    Calcium 8.3 (*)    Albumin 2.8 (*)    Total Bilirubin 1.3 (*)    All other components within  normal limits  CBC WITH DIFFERENTIAL/PLATELET - Abnormal; Notable for the following components:   WBC 18.4 (*)    RBC 4.16 (*)    Hemoglobin 11.3 (*)    HCT 33.8 (*)    Neutro Abs 14.8 (*)    Monocytes Absolute 1.9 (*)    Abs Immature Granulocytes 0.15 (*)    All other components within normal limits  CULTURE, BLOOD (ROUTINE X 2)  CULTURE, BLOOD (ROUTINE X 2)  URINALYSIS, W/ REFLEX TO CULTURE (INFECTION SUSPECTED)  I-STAT CG4 LACTIC ACID, ED  I-STAT CG4 LACTIC ACID, ED  TROPONIN I (HIGH SENSITIVITY)  TROPONIN I (HIGH SENSITIVITY)    EKG: EKG Interpretation Date/Time:  Friday March 13 2024 19:36:42 EDT Ventricular Rate:  68 PR Interval:  132 QRS Duration:  103 QT Interval:  420 QTC Calculation: 447 R Axis:   60  Text Interpretation: Sinus rhythm Low voltage, precordial leads t wave inversion 3, otherwise normal Confirmed by [name] [id number]) on 03/13/2024 8:12:46 PM  Radiology: [facility] Hand Complete Left Result Date: 03/13/2024 CLINICAL DATA:  Evaluate for osteomyelitis. Left hand x-ray 11/13/2015 EXAM: LEFT HAND - COMPLETE 3+ VIEW COMPARISON:  None Available. FINDINGS:  There is soft tissue swelling and irregularity of the skin overlying the distal fifth metacarpal. There is a single punctate linear 1.5 mm foreign body within the soft tissues or on the skin surface. There is no underlying cortical erosion or periosteal reaction. There is no acute fracture or dislocation. There are moderate degenerative changes of the third distal interphalangeal joint. There are mild degenerative cystic changes of the proximal carpal row. IMPRESSION: Soft tissue swelling and irregularity overlying the distal fifth metacarpal. There is a single punctate linear foreign body within the soft tissues or on the skin surface. No radiographic evidence of osteomyelitis. Electronically Signed   By: Greig Pique M.D.   On: 03/13/2024 19:38   DG Forearm Right Result Date: 03/13/2024 CLINICAL DATA:  Abscess on  left hand. EXAM: RIGHT FOREARM - 2 VIEW COMPARISON:  None Available. FINDINGS: There is no evidence of fracture or other focal bone lesions. There is soft tissue swelling of the forearm. No radiopaque foreign body. IMPRESSION: Soft tissue swelling of the forearm. No radiopaque foreign body. Electronically Signed   By: Greig Pique M.D.   On: 03/13/2024 19:27   DG Chest 2 View Result Date: 03/13/2024 EXAM: 2 VIEW(S) XRAY OF THE CHEST 03/13/2024 03:08:00 PM COMPARISON: None available. CLINICAL HISTORY: 10220 Abscess [id number]. Reason for exam: Abscess; Triage notes: Abscess on left hand, states it has been there for a couple weeks. Pt complains of abscess x3, right sided chest pain. FINDINGS: LUNGS AND PLEURA: No focal pulmonary opacity. No pulmonary edema. No pleural effusion. No pneumothorax. Diffuse interstitial thickening and coarsening. HEART AND MEDIASTINUM: No acute abnormality of the cardiac and mediastinal silhouettes. BONES AND SOFT TISSUES: No acute osseous abnormality. IMPRESSION: 1. No acute process. 2. diffuse interstitial coarsening, likely related to smoking -chronic bronchitis. Electronically signed by: Rockey Kilts MD 03/13/2024 03:21 PM EDT RP Workstation: [id number]     Medications Ordered in the ED  vancomycin  (VANCOCIN ) IVPB 1000 mg/200 mL premix (1,000 mg Intravenous New Bag/Given 03/13/24 2007)  acetaminophen  (TYLENOL ) tablet 650 mg (650 mg Oral Given 03/13/24 1514)    Clinical Course as of 03/13/24 2102  Fri Mar 13, 2024  [id number] 33 year old male history of IV drug use here with worsening infections right arm.  Multiple superficial ulcerations and areas of cellulitis.  Labs showing elevated white count.  Will need IV antibiotics and medical admission if patient is agreeable [MB]    Clinical Course User Index [MB] [name] BROCKS, MD                                 Medical Decision Making Amount and/or Complexity of Data Reviewed Labs: ordered. Radiology: ordered.  Risk OTC  drugs. Prescription drug management. Decision regarding hospitalization.   Patient presents to the ED for concern of abscess/wound, this involves an extensive number of treatment options, and is a complaint that carries with it a high risk of complications and morbidity.  The differential diagnosis includes abscess, cellulitis, osteomyelitis   Co morbidities that complicate the patient evaluation  IVDU   Additional history obtained:  Additional history obtained from Nursing   External records from outside source obtained and reviewed including triage RN note   Lab Tests:  I Ordered, and personally interpreted labs.  The pertinent results include:   WBC 18.4 Hgb 11.3 Lactic 1.2 NA 130 Troponin WNL BC pending   Imaging Studies ordered:  I ordered imaging studies including chest x-ray, right  forearm, left hand I independently visualized and interpreted imaging which showed no acute cardiopulmonary pathology nor signs of osteomyelitis I agree with the radiologist interpretation   Cardiac Monitoring:  The patient was maintained on a cardiac monitor.  I personally viewed and interpreted the cardiac monitored which showed an underlying rhythm of: NSR with new T wave inversion in lead III   Medicines ordered and prescription drug management:  I ordered medication including vanc  for skin infection  Reevaluation of the patient after these medicines showed that the patient stayed the same I have reviewed the patients home medicines and have made adjustments as needed    Consultations Obtained:  I requested consultation with the hospitalist,  and discussed lab and imaging findings as well as pertinent plan - they recommend: Dr. Charlton accepts patient for admission   Problem List / ED Course:  Wound infection Purulent, odorous Obtained BC as pt is IVDU with mild fever of 100.68F, leukocytosis XR to negative for osteo Vanc for purulent cellulitis, abscess No other signs  of infection. No urinary complaints nor cough, congestion. CXR wo PNA CP EKG did show new inversion of T wave in lead III Atypical presentation for ACS. Reproducible by palpation and nonexertional Troponin neg CXR neg No radiation into the back.  No complaints of shortness of breath.  No neurodeficits.  Low suspicion for dissection.   Reevaluation:  After the interventions noted above, I reevaluated the patient and found that they have :stayed the same   Social Determinants of Health:  Current tobacco use IVDU   Dispostion:  After consideration of the diagnostic results and the patients response to treatment, I feel that the patent would benefit from admission for IV antibiotics.   Discussed ED workup, disposition, return to ED precautions with patient who expresses understanding agrees with plan.  All questions answered to their satisfaction.  They are agreeable to plan.  Discharge instructions provided on paperwork  Discussed pt, ED workup with Dr. Towana who reviewed ED workup and agrees with plan  Final diagnoses:  Abscess  [facility]ubstance abuse [facility])    ED Discharge Orders     None        Minnie Tinnie BRAVO, PA 03/13/24 2104    [name] BROCKS, MD 03/14/24 [phone]

## 2024-03-13 NOTE — ED Provider Triage Note (Signed)
 Emergency Medicine Provider Triage Evaluation Note  Daniel Bentley , a 33 y.o. male  was evaluated in triage.  Pt complains of abscess x3, right sided chest pain  Was attempting to get up with her girlfriend but was unable to lift him up so he fell backwards onto concrete onto back  Injects fentanyl   Review of Systems  Positive: See hpi Negative:   Physical Exam  BP 110/65 (BP Location: Left Arm)   Pulse 100   Temp (!) 100.4 F (38 C)   Resp 20   Ht 5' 6 (1.676 m)   Wt 68 kg   SpO2 97%   BMI 24.21 kg/m  Gen:   Awake, no distress   Resp:  Normal effort  MSK:   Moves extremities without difficulty. TTP of right anterior chest wall Other:  Wound to right wrist. Purulent drainage from wound on left hand and right elbow  Medical Decision Making  Medically screening exam initiated at 2:54 PM.  Appropriate orders placed.  Daniel Bentley was informed that the remainder of the evaluation will be completed by another provider, this initial triage assessment does not replace that evaluation, and the importance of remaining in the ED until their evaluation is complete.  Labs and cxr ordered   Daniel Bentley, Daniel Bentley 03/13/24 [phone]

## 2024-03-13 NOTE — Progress Notes (Signed)
 Pharmacy Antibiotic Note  Daniel Bentley is a 33 y.o. male for which pharmacy has been consulted for vancomycin  dosing for cellulitis.  Patient with a history of IVDU. Patient presenting with draining abscess.  SCr 0.85 WBC 18.4; LA 1.2; T 100.4>99.9; HR 98; RR 18  Plan: Vancomycin  1000 mg given once in ED --Will start 1250 mg q12hr (eAUC 531.2; Scr 0.85; IBW; Vd 0.72, Cmax 37.3; Cmin 13.6) unless change in renal function Monitor WBC, fever, renal function, cultures De-escalate when able Levels at steady state  Height: 5' 6 (167.6 cm) Weight: 68 kg (150 lb) IBW/kg (Calculated) : 63.8  Temp (24hrs), Avg:100.2 F (37.9 C), Min:99.9 F (37.7 C), Max:100.4 F (38 C)  Recent Labs  Lab 03/13/24 1452 03/13/24 1457  WBC 18.4*  --   CREATININE 0.85  --   LATICACIDVEN  --  1.2    Estimated Creatinine Clearance: 110.5 mL/min (by C-G formula based on SCr of 0.85 mg/dL).    No Known Allergies  Microbiology results: Pending  Thank you for allowing pharmacy to be a part of this patient's care.  Dorn Buttner, PharmD, BCPS 03/13/2024 9:38 PM ED Clinical Pharmacist -  [phone]

## 2024-03-13 NOTE — ED Notes (Signed)
 Extra SST, DG, Red & Blue top and BLC drawn

## 2024-03-13 NOTE — ED Triage Notes (Signed)
 Abscess on left hand, states it has been there for a couple weeks.

## 2024-03-14 ENCOUNTER — Encounter (HOSPITAL_COMMUNITY)
Admission: EM | Discharge status: Against Medical Advice | Payer: Self-pay | Source: Home / Self Care | Attending: Internal Medicine

## 2024-03-14 ENCOUNTER — Encounter (HOSPITAL_COMMUNITY): Payer: Self-pay | Admitting: Family Medicine

## 2024-03-14 ENCOUNTER — Inpatient Hospital Stay (HOSPITAL_COMMUNITY): Admitting: Certified Registered Nurse Anesthetist

## 2024-03-14 DIAGNOSIS — L089 Local infection of the skin and subcutaneous tissue, unspecified: Secondary | ICD-10-CM | POA: Diagnosis not present

## 2024-03-14 DIAGNOSIS — R739 Hyperglycemia, unspecified: Secondary | ICD-10-CM | POA: Diagnosis not present

## 2024-03-14 DIAGNOSIS — F1910 Other psychoactive substance abuse, uncomplicated: Secondary | ICD-10-CM | POA: Diagnosis not present

## 2024-03-14 DIAGNOSIS — L03119 Cellulitis of unspecified part of limb: Secondary | ICD-10-CM | POA: Diagnosis not present

## 2024-03-14 DIAGNOSIS — E871 Hypo-osmolality and hyponatremia: Secondary | ICD-10-CM | POA: Diagnosis not present

## 2024-03-14 HISTORY — PX: INCISION AND DRAINAGE OF WOUND: SHX1803

## 2024-03-14 LAB — CBC
HCT: 30.6 % — ABNORMAL LOW (ref 39.0–52.0)
HCT: 33.8 % — ABNORMAL LOW (ref 39.0–52.0)
Hemoglobin: 10.5 g/dL — ABNORMAL LOW (ref 13.0–17.0)
Hemoglobin: 11.4 g/dL — ABNORMAL LOW (ref 13.0–17.0)
MCH: 27.4 pg (ref 26.0–34.0)
MCH: 27.2 pg (ref 26.0–34.0)
MCHC: 34.3 g/dL (ref 30.0–36.0)
MCHC: 33.7 g/dL (ref 30.0–36.0)
MCV: 79.9 fL — ABNORMAL LOW (ref 80.0–100.0)
MCV: 80.7 fL (ref 80.0–100.0)
Platelets: 167 K/uL (ref 150–400)
Platelets: 190 K/uL (ref 150–400)
RBC: 3.83 MIL/uL — ABNORMAL LOW (ref 4.22–5.81)
RBC: 4.19 MIL/uL — ABNORMAL LOW (ref 4.22–5.81)
RDW: 13.6 % (ref 11.5–15.5)
RDW: 13.8 % (ref 11.5–15.5)
WBC: 14.3 K/uL — ABNORMAL HIGH (ref 4.0–10.5)
WBC: 16.6 K/uL — ABNORMAL HIGH (ref 4.0–10.5)
nRBC: 0 % (ref 0.0–0.2)
nRBC: 0 % (ref 0.0–0.2)

## 2024-03-14 LAB — BASIC METABOLIC PANEL WITH GFR
Anion gap: 9 (ref 5–15)
BUN: 9 mg/dL (ref 6–20)
CO2: 20 mmol/L — ABNORMAL LOW (ref 22–32)
Calcium: 8 mg/dL — ABNORMAL LOW (ref 8.9–10.3)
Chloride: 103 mmol/L (ref 98–111)
Creatinine, Ser: 0.64 mg/dL (ref 0.61–1.24)
GFR, Estimated: >60 mL/min (ref >60)
Glucose, Bld: 179 mg/dL — ABNORMAL HIGH (ref 70–99)
Potassium: 3.5 mmol/L (ref 3.5–5.1)
Sodium: 132 mmol/L — ABNORMAL LOW (ref 135–145)

## 2024-03-14 LAB — HIV ANTIBODY (ROUTINE TESTING W REFLEX): HIV Screen 4th Generation wRfx: NONREACTIVE

## 2024-03-14 LAB — SEDIMENTATION RATE: Sed Rate: 68 mm/h — ABNORMAL HIGH (ref 0–16)

## 2024-03-14 LAB — C-REACTIVE PROTEIN: CRP: 20.3 mg/dL — ABNORMAL HIGH (ref <1.0)

## 2024-03-14 SURGERY — IRRIGATION AND DEBRIDEMENT WOUND
Anesthesia: General | Laterality: Bilateral

## 2024-03-14 MED ORDER — MIDAZOLAM HCL 2 MG/2ML IJ SOLN
INTRAMUSCULAR | Status: DC | PRN
  Administered 2024-03-14: 2 mg via INTRAVENOUS

## 2024-03-14 MED ORDER — OXYCODONE HCL 5 MG PO TABS: 5.0000 mg | ORAL_TABLET | Freq: Once | ORAL | Status: DC | PRN

## 2024-03-14 MED ORDER — ONDANSETRON HCL 4 MG/2ML IJ SOLN
INTRAMUSCULAR | Status: DC | PRN
  Administered 2024-03-14: 4 mg via INTRAVENOUS

## 2024-03-14 MED ORDER — OXYCODONE HCL 5 MG/5ML PO SOLN: 5.0000 mg | Freq: Once | ORAL | Status: DC | PRN

## 2024-03-14 MED ORDER — SODIUM CHLORIDE 0.9 % IR SOLN
Status: DC | PRN
  Administered 2024-03-14 (×2): 3000 mL

## 2024-03-14 MED ORDER — FENTANYL CITRATE (PF) 100 MCG/2ML IJ SOLN
INTRAMUSCULAR | Status: AC
  Filled 2024-03-14: qty 2

## 2024-03-14 MED ORDER — ACETAMINOPHEN 10 MG/ML IV SOLN
1000.0000 mg | Freq: Once | INTRAVENOUS | Status: DC | PRN
Start: 2024-03-14 — End: 2024-03-14

## 2024-03-14 MED ORDER — BUPIVACAINE HCL (PF) 0.25 % IJ SOLN
INTRAMUSCULAR | Status: DC | PRN
  Administered 2024-03-14: 16 mL

## 2024-03-14 MED ORDER — DROPERIDOL 2.5 MG/ML IJ SOLN: 0.6250 mg | Freq: Once | INTRAMUSCULAR | Status: DC | PRN

## 2024-03-14 MED ORDER — 0.9 % SODIUM CHLORIDE (POUR BTL) OPTIME
TOPICAL | Status: DC | PRN
  Administered 2024-03-14: 1000 mL

## 2024-03-14 MED ORDER — PROPOFOL 10 MG/ML IV BOLUS
INTRAVENOUS | Status: AC
  Filled 2024-03-14: qty 20

## 2024-03-14 MED ORDER — FENTANYL CITRATE (PF) 100 MCG/2ML IJ SOLN
25.0000 ug | INTRAMUSCULAR | Status: DC | PRN
  Administered 2024-03-14 (×2): 50 ug via INTRAVENOUS

## 2024-03-14 MED ORDER — FENTANYL CITRATE (PF) 250 MCG/5ML IJ SOLN
INTRAMUSCULAR | Status: AC
Start: 2024-03-14 — End: 2024-03-14
  Filled 2024-03-14: qty 5

## 2024-03-14 MED ORDER — KETAMINE HCL 50 MG/5ML IJ SOSY
PREFILLED_SYRINGE | INTRAMUSCULAR | Status: AC
Start: 2024-03-14 — End: 2024-03-14
  Filled 2024-03-14: qty 5

## 2024-03-14 MED ORDER — FENTANYL CITRATE (PF) 250 MCG/5ML IJ SOLN
INTRAMUSCULAR | Status: DC | PRN
  Administered 2024-03-14: 100 ug via INTRAVENOUS
  Administered 2024-03-14 (×3): 50 ug via INTRAVENOUS

## 2024-03-14 MED ORDER — BUPIVACAINE HCL (PF) 0.25 % IJ SOLN
INTRAMUSCULAR | Status: AC
  Filled 2024-03-14: qty 30

## 2024-03-14 MED ORDER — PROPOFOL 10 MG/ML IV BOLUS
INTRAVENOUS | Status: DC | PRN
  Administered 2024-03-14: 30 mg via INTRAVENOUS
  Administered 2024-03-14: 150 mg via INTRAVENOUS

## 2024-03-14 MED ORDER — MIDAZOLAM HCL 2 MG/2ML IJ SOLN
INTRAMUSCULAR | Status: AC
  Filled 2024-03-14: qty 2

## 2024-03-14 MED ORDER — LACTATED RINGERS IV SOLN
INTRAVENOUS | Status: DC | PRN
Start: 2024-03-14 — End: 2024-03-14

## 2024-03-14 MED ORDER — DEXMEDETOMIDINE HCL IN NACL 80 MCG/20ML IV SOLN
INTRAVENOUS | Status: DC | PRN
  Administered 2024-03-14 (×3): 4 ug via INTRAVENOUS

## 2024-03-14 MED ORDER — LIDOCAINE 2% (20 MG/ML) 5 ML SYRINGE
INTRAMUSCULAR | Status: DC | PRN
  Administered 2024-03-14: 80 mg via INTRAVENOUS

## 2024-03-14 MED ORDER — DEXAMETHASONE SODIUM PHOSPHATE 10 MG/ML IJ SOLN
INTRAMUSCULAR | Status: DC | PRN
  Administered 2024-03-14: 10 mg via INTRAVENOUS

## 2024-03-14 MED ORDER — KETAMINE HCL 10 MG/ML IJ SOLN
INTRAMUSCULAR | Status: DC | PRN
  Administered 2024-03-14 (×2): 10 mg via INTRAVENOUS
  Administered 2024-03-14: 30 mg via INTRAVENOUS

## 2024-03-14 SURGICAL SUPPLY — 40 items
ADAPTER CATH SYR TO TUBING 38M (ADAPTER) ×1 IMPLANT
BAG COUNTER SPONGE SURGICOUNT (BAG) ×1 IMPLANT
BNDG ELASTIC 3INX 5YD STR LF (GAUZE/BANDAGES/DRESSINGS) ×1 IMPLANT
BNDG ELASTIC 4X5.8 VLCR STR LF (GAUZE/BANDAGES/DRESSINGS) ×1 IMPLANT
BNDG ELASTIC 6X10 VLCR STRL LF (GAUZE/BANDAGES/DRESSINGS) IMPLANT
BNDG GAUZE DERMACEA FLUFF 4 (GAUZE/BANDAGES/DRESSINGS) ×1 IMPLANT
CORD BIPOLAR FORCEPS 12FT (ELECTRODE) ×1 IMPLANT
COVER SURGICAL LIGHT HANDLE (MISCELLANEOUS) ×1 IMPLANT
CUFF TOURN SGL QUICK 18X4 (TOURNIQUET CUFF) IMPLANT
DRAPE EXTREMITY T 121X128X90 (DISPOSABLE) IMPLANT
DRAPE OEC MINIVIEW 54X84 (DRAPES) IMPLANT
GAUZE PACKING IODOFORM 1/4X15 (PACKING) IMPLANT
GAUZE PAD ABD 8X10 STRL (GAUZE/BANDAGES/DRESSINGS) ×2 IMPLANT
GAUZE SPONGE 4X4 12PLY STRL (GAUZE/BANDAGES/DRESSINGS) ×1 IMPLANT
GAUZE XEROFORM 1X8 LF (GAUZE/BANDAGES/DRESSINGS) ×1 IMPLANT
GLOVE BIO SURGEON STRL SZ7.5 (GLOVE) ×1 IMPLANT
GLOVE BIOGEL PI IND STRL 8 (GLOVE) ×1 IMPLANT
GOWN STRL REUS W/ TWL LRG LVL3 (GOWN DISPOSABLE) ×1 IMPLANT
GOWN STRL REUS W/ TWL XL LVL3 (GOWN DISPOSABLE) ×1 IMPLANT
KIT BASIN OR (CUSTOM PROCEDURE TRAY) ×1 IMPLANT
KIT TURNOVER KIT B (KITS) ×1 IMPLANT
MANIFOLD NEPTUNE II (INSTRUMENTS) IMPLANT
NDL HYPO 25X1 1.5 SAFETY (NEEDLE) IMPLANT
NEEDLE HYPO 25X1 1.5 SAFETY (NEEDLE) ×3 IMPLANT
NS IRRIG 1000ML POUR BTL (IV SOLUTION) ×1 IMPLANT
PACK ORTHO EXTREMITY (CUSTOM PROCEDURE TRAY) ×1 IMPLANT
PAD ABD 8X10 STRL (GAUZE/BANDAGES/DRESSINGS) IMPLANT
PAD ARMBOARD POSITIONER FOAM (MISCELLANEOUS) ×2 IMPLANT
PADDING CAST ABS COTTON 4X4 ST (CAST SUPPLIES) IMPLANT
SET CYSTO W/LG BORE CLAMP LF (SET/KITS/TRAYS/PACK) IMPLANT
SOL PREP POV-IOD 4OZ 10% (MISCELLANEOUS) ×2 IMPLANT
SPIKE FLUID TRANSFER (MISCELLANEOUS) ×1 IMPLANT
SPONGE T-LAP 4X18 ~~LOC~~+RFID (SPONGE) ×1 IMPLANT
SWAB CULTURE ESWAB REG 1ML (MISCELLANEOUS) IMPLANT
SYR 20ML LL LF (SYRINGE) ×1 IMPLANT
SYR CONTROL 10ML LL (SYRINGE) IMPLANT
TOWEL GREEN STERILE (TOWEL DISPOSABLE) ×1 IMPLANT
TUBE CONNECTING 12X1/4 (SUCTIONS) ×1 IMPLANT
UNDERPAD 30X36 HEAVY ABSORB (UNDERPADS AND DIAPERS) ×1 IMPLANT
YANKAUER SUCT BULB TIP NO VENT (SUCTIONS) ×1 IMPLANT

## 2024-03-14 NOTE — Anesthesia Preprocedure Evaluation (Addendum)
 Anesthesia Evaluation  Patient identified by MRN, date of birth, ID band Patient awake    Reviewed: Allergy & Precautions, H&P , NPO status , Patient's Chart, lab work & pertinent test results  Airway Mallampati: II  TM Distance: >3 FB Neck ROM: Full    Dental no notable dental hx. (+) Dental Advisory Given   Pulmonary Current Smoker   Pulmonary exam normal breath sounds clear to auscultation       Cardiovascular negative cardio ROS Normal cardiovascular exam Rhythm:Regular Rate:Normal     Neuro/Psych negative neurological ROS  negative psych ROS   GI/Hepatic negative GI ROS,,,(+)     substance abuse  IV drug useIV drug abuse    Endo/Other  negative endocrine ROS    Renal/GU negative Renal ROS  negative genitourinary   Musculoskeletal negative musculoskeletal ROS (+)  narcotic dependent  Abdominal   Peds negative pediatric ROS (+)  Hematology negative hematology ROS (+)   Anesthesia Other Findings   Reproductive/Obstetrics negative OB ROS                              Anesthesia Physical Anesthesia Plan  ASA: 2  Anesthesia Plan: General   Post-op Pain Management: Tylenol  PO (pre-op)*   Induction: Intravenous  PONV Risk Score and Plan: 1 and Ondansetron  and Dexamethasone   Airway Management Planned: LMA  Additional Equipment: None  Intra-op Plan:   Post-operative Plan: Extubation in OR  Informed Consent: I have reviewed the patients History and Physical, chart, labs and discussed the procedure including the risks, benefits and alternatives for the proposed anesthesia with the patient or authorized representative who has indicated his/her understanding and acceptance.     Dental advisory given  Plan Discussed with: CRNA  Anesthesia Plan Comments:          Anesthesia Quick Evaluation
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
## 2024-03-14 NOTE — Plan of Care (Signed)

## 2024-03-14 NOTE — Anesthesia Procedure Notes (Signed)
 Procedure Name: LMA Insertion Date/Time: 03/14/2024 9:21 PM  Performed by: Celia Alan HERO, CRNAPre-anesthesia Checklist: Emergency Drugs available, Suction available, Patient identified, Patient being monitored and Timeout performed Patient Re-evaluated:Patient Re-evaluated prior to induction Oxygen Delivery Method: Circle system utilized Preoxygenation: Pre-oxygenation with 100% oxygen Induction Type: IV induction LMA: LMA inserted LMA Size: 4.0 Number of attempts: 1 Placement Confirmation: positive ETCO2 and breath sounds checked- equal and bilateral Tube secured with: Tape Dental Injury: Teeth and Oropharynx as per pre-operative assessment

## 2024-03-14 NOTE — Progress Notes (Signed)
 Progress Note   Patient: Daniel Bentley FMW:[id number] DOB: 1991/07/06 DOA: 03/13/2024     1 DOS: the patient was seen and examined on 03/14/2024   Brief hospital course: JAH ALARID is a 33 y.o. male with medical history significant for IV drug abuse and opiate dependence who presents with draining abscesses involving the left hand and right elbow.   Patient is started on IV vancomycin , gentle IV fluids admitted to TRH service. Left hand xray showed foreign body, hand surgery consulted.  Assessment and Plan: Purulent cellulitis and abscess b/l upper extremities  Continue vancomycin  as per pharmacy protocol. Follow blood cultures. WBC trending down. Given foreign body seen on xray left hand, I discussed with Dr. Murrell who will evaluate patient today for possible I&D, NPO ordered.    IVDA, opiate dependence  Discussed ill effects of IV drug use. Continue to monitor for withdrawal symptoms. TOC for substance use consulted.   Hyponatremia  Mild hyponatremia noted in setting of hypovolemia. Improving. Continue gentle hydration. Trend Na.  Hyperglycemia Noted on AM BMP. Check A1c.       Out of bed to chair. Incentive spirometry. Nursing supportive care. Fall, aspiration precautions. Diet:  Diet Orders (From admission, onward)     Start     Ordered   03/13/24 2125  Diet regular Room service appropriate? Yes; Fluid consistency: Thin  Diet effective now       Question Answer Comment  Room service appropriate? Yes   Fluid consistency: Thin      03/13/24 2125           DVT prophylaxis: enoxaparin  (LOVENOX ) injection 40 mg Start: 03/13/24 2130  Level of care: Med-Surg   Code Status: Full Code  Subjective: Patient is seen and examined today morning. He is sleeping comfortably. Denies pain. Tmax 100.4. eating fair.  Physical Exam: Vitals:   03/14/24 0124 03/14/24 0222 03/14/24 0411 03/14/24 0735  BP: 104/67 109/61 103/61 (!) 102/55  Pulse:  80 76 73  Resp:  16 16    Temp:  99.5 F (37.5 C) 98.2 F (36.8 C) 98.6 F (37 C)  TempSrc:  Oral Oral   SpO2:  100% 100% 100%  Weight:      Height:        General - Young  Caucasian male, no apparent distress HEENT - PERRLA, EOMI, atraumatic head, non tender sinuses. Lung - Clear, basal rales, no rhonchi, wheezes. Heart - S1, S2 heard, no murmurs, rubs, no pedal edema. Abdomen - Soft, non tender, bowel sounds good Neuro - Alert, awake and oriented x 3, non focal exam. Skin - Wounds of upper extremity noted.          Data Reviewed:      Latest Ref Rng & Units 03/14/2024    5:16 AM 03/13/2024    2:52 PM 05/28/2019    8:52 AM  CBC  WBC 4.0 - 10.5 K/uL 14.3  18.4  9.3   Hemoglobin 13.0 - 17.0 g/dL 89.4  88.6  85.2   Hematocrit 39.0 - 52.0 % 30.6  33.8  41.5   Platelets 150 - 400 K/uL 167  190  325       Latest Ref Rng & Units 03/14/2024    5:16 AM 03/13/2024    2:52 PM 05/28/2019    8:52 AM  BMP  Glucose 70 - 99 mg/dL 820  887  872   BUN 6 - 20 mg/dL 9  10  12    Creatinine 0.61 -  1.24 mg/dL 9.35  9.14  8.92   Sodium 135 - 145 mmol/L 132  130  140   Potassium 3.5 - 5.1 mmol/L 3.5  3.8  3.3   Chloride 98 - 111 mmol/L 103  98  106   CO2 22 - 32 mmol/L 20  20  24    Calcium 8.9 - 10.3 mg/dL 8.0  8.3  9.6    DG Hand Complete Left Result Date: 03/13/2024 CLINICAL DATA:  Evaluate for osteomyelitis. Left hand x-ray 11/13/2015 EXAM: LEFT HAND - COMPLETE 3+ VIEW COMPARISON:  None Available. FINDINGS: There is soft tissue swelling and irregularity of the skin overlying the distal fifth metacarpal. There is a single punctate linear 1.5 mm foreign body within the soft tissues or on the skin surface. There is no underlying cortical erosion or periosteal reaction. There is no acute fracture or dislocation. There are moderate degenerative changes of the third distal interphalangeal joint. There are mild degenerative cystic changes of the proximal carpal row. IMPRESSION: Soft tissue swelling and irregularity  overlying the distal fifth metacarpal. There is a single punctate linear foreign body within the soft tissues or on the skin surface. No radiographic evidence of osteomyelitis. Electronically Signed   By: Greig Pique M.D.   On: 03/13/2024 19:38   DG Forearm Right Result Date: 03/13/2024 CLINICAL DATA:  Abscess on left hand. EXAM: RIGHT FOREARM - 2 VIEW COMPARISON:  None Available. FINDINGS: There is no evidence of fracture or other focal bone lesions. There is soft tissue swelling of the forearm. No radiopaque foreign body. IMPRESSION: Soft tissue swelling of the forearm. No radiopaque foreign body. Electronically Signed   By: Greig Pique M.D.   On: 03/13/2024 19:27   DG Chest 2 View Result Date: 03/13/2024 EXAM: 2 VIEW(S) XRAY OF THE CHEST 03/13/2024 03:08:00 PM COMPARISON: None available. CLINICAL HISTORY: 10220 Abscess [id number]. Reason for exam: Abscess; Triage notes: Abscess on left hand, states it has been there for a couple weeks. Pt complains of abscess x3, right sided chest pain. FINDINGS: LUNGS AND PLEURA: No focal pulmonary opacity. No pulmonary edema. No pleural effusion. No pneumothorax. Diffuse interstitial thickening and coarsening. HEART AND MEDIASTINUM: No acute abnormality of the cardiac and mediastinal silhouettes. BONES AND SOFT TISSUES: No acute osseous abnormality. IMPRESSION: 1. No acute process. 2. diffuse interstitial coarsening, likely related to smoking -chronic bronchitis. Electronically signed by: Rockey Kilts MD 03/13/2024 03:21 PM EDT RP Workstation: [id number]    Family Communication: Discussed with patient, understand and agree. All questions answered.  Disposition: Status is: Inpatient Remains inpatient appropriate because: IV antibiotic, hand surgery  Planned Discharge Destination: Home     Time spent: 45 minutes  Author: Concepcion Riser, MD 03/14/2024 11:10 AM Secure chat 7am to 7pm For on call review [email or web address].

## 2024-03-14 NOTE — ED Notes (Signed)
 This RN called the floor to alert them that the pt will be coming up in the next 10 min

## 2024-03-14 NOTE — Transfer of Care (Signed)
 Immediate Anesthesia Transfer of Care Note  Patient: Daniel Bentley  Procedure(s) Performed: IRRIGATION AND DEBRIDEMENT WOUND (Bilateral)  Patient Location: PACU  Anesthesia Type:General  Level of Consciousness: awake and alert   Airway & Oxygen Therapy: Patient Spontanous Breathing  Post-op Assessment: Report given to RN and Post -op Vital signs reviewed and stable  Post vital signs: Reviewed and stable  Last Vitals:  Vitals Value Taken Time  BP 132/91 03/14/24 22:49  Temp    Pulse    Resp 19 03/14/24 22:53  SpO2    Vitals shown include unfiled device data.  Last Pain:  Vitals:   03/14/24 2034  TempSrc:   PainSc: 4          Complications: No notable events documented.

## 2024-03-14 NOTE — Op Note (Signed)
 NAME: Daniel Bentley MEDICAL RECORD NO: [id number] DATE OF BIRTH: 08-26-1990 FACILITY: [facility] LOCATION: [facility] OR PHYSICIAN: Dorene Bruni R. [name], MD   OPERATIVE REPORT   DATE OF PROCEDURE: 03/14/24    PREOPERATIVE DIAGNOSIS: Right forearm and elbow infection, left hand infection   POSTOPERATIVE DIAGNOSIS: 1.  Right forearm skin infection 2.  Right elbow abscess 3.  Left hand abscess   PROCEDURE: 1.  Incision and drainage right forearm infection 2.  Incision and drainage right elbow abscess 3.  Incision and drainage left hand abscess   SURGEON:  Franky Curia, M.D.   ASSISTANT: none   ANESTHESIA:  General   INTRAVENOUS FLUIDS:  Per anesthesia flow sheet.   ESTIMATED BLOOD LOSS:  Minimal.   COMPLICATIONS:  None.   SPECIMENS: Cultures to micro   TOURNIQUET TIME:    Total Tourniquet Time Documented: Upper Arm (Right) - 28 minutes Total: Upper Arm (Right) - 28 minutes  Forearm (Left) - 18 minutes Total: Forearm (Left) - 18 minutes    DISPOSITION:  Stable to PACU.   INDICATIONS: 33 year old male states over the past 10 days or so he has had wounds of the right forearm right elbow and left hand.  These are painful.  He was started on IV antibiotics.  He has had increase in his WBC.  There is draining wound at the right elbow.  Recommend incision and drainage in the operating room.  Risks, benefits and alternatives of surgery were discussed including the risks of blood loss, infection, damage to nerves, vessels, tendons, ligaments, bone for surgery, need for additional surgery, complications with wound healing, continued pain, stiffness, , need for repeat irrigation and debridement.  He voiced understanding of these risks and elected to proceed.  OPERATIVE COURSE:  After being identified preoperatively by myself,  the patient and I agreed on the procedure and site of the procedure.  The surgical site was marked.  Surgical consent had been signed.  He is on scheduled antibiotics.  He  was transferred to the operating room and placed on the operating table in supine position with the Bilateral upper extremity on an arm board.  General anesthesia was induced by the anesthesiologist.  Bilateral upper extremity was prepped and draped in normal sterile orthopedic fashion.  A surgical pause was performed between the surgeons, anesthesia, and operating room staff and all were in agreement as to the patient, procedure, and site of procedure.  Tourniquet at the proximal aspect of the right upper extremity was inflated to 250 mmHg after exsanguination of the arm with an Esmarch bandage.  At the radial side of the wrist is wound was debrided.  The wound did not go into the subcutaneous tissues.  There was no fluctuance.  No incision was made for this.  At the dorsal ulnar aspect of the distal forearm there is a second wound.  This is debrided.  This went slightly into the subcutaneous tissues.  Incision was made through the area.  No gross purulence was encountered.  At the medial side of the elbow he had a draining wound.  There was a scab forming over.  This was removed.  The wound was extended proximally and distally.  There was purulence within the wound.  Cultures were taken for aerobes and anaerobes.  Basilic vein was identified and protected.  The wound was debrided of devitalized tissue.  The rongeurs were used to lightly debride purulent material.  Bipolar electrocautery is used to obtain hemostasis.  The wounds were copiously irrigated  with sterile saline.  There were then packed with quarter inch iodoform gauze.  They were injected with quarter percent plain Marcaine  to aid in postoperative analgesia.  There were then dressed with sterile 4 x 4's and ABD and wrapped with Kerlix and Ace bandage.  The tourniquet was deflated at 28 minutes.  Fingertips were pink with brisk capillary refill after deflation of tourniquet.  The left hand was then sterilely prepped and draped.  Tourniquet the proximal  aspect of the forearm was inflated to 250 mmHg after exsanguination of the limb with an Esmarch bandage.  C-arm was used to localize the small radiopaque foreign body on the dorsum of the hand.  The scab that was forming over the wound was debrided.  The foreign body was localized and removed.  It was metallic.  C-arm was used to ensure removal of the radiopaque foreign body which was the case.  The wound was extended proximally and distally.  There was minimal purulence.  There was a vein in the wound which was protected.  Bipolar electrocautery is used to obtain hemostasis.  Devitalized tissues were sharply removed with the scissors and knife.  The wound was copiously irrigated with sterile saline.  It was then packed with quarter inch iodoform gauze.  Was injected with quarter percent plain Marcaine  to aid in postoperative analgesia.  It was dressed with sterile 4 x 4's and wrapped with Kerlix and Ace bandage.  Tourniquet was deflated at 18 minutes.  The operative  drapes were broken down.  The patient was awoken from anesthesia safely.  He was transferred back to the stretcher and taken to PACU in stable condition.  He is admitted to the hospitalist for IV antibiotics.  Start hydrotherapy in 2 to 4 days.   Gilman Olazabal, MD Electronically signed, 03/14/24

## 2024-03-14 NOTE — Progress Notes (Signed)
 Postop note: Status post incision and drainage bilateral upper extremities.  Purulence in right elbow wound.  Cultures taken.  Foreign body removed left hand.  Start hydrotherapy in 2 to 4 days.  Okay for DVT prophylaxis.  Okay for discharge home when afebrile, white blood count trending to normal, pain controlled.

## 2024-03-14 NOTE — Discharge Instructions (Signed)

## 2024-03-15 ENCOUNTER — Other Ambulatory Visit: Payer: Self-pay

## 2024-03-15 DIAGNOSIS — L03119 Cellulitis of unspecified part of limb: Secondary | ICD-10-CM | POA: Diagnosis not present

## 2024-03-15 DIAGNOSIS — L02419 Cutaneous abscess of limb, unspecified: Secondary | ICD-10-CM | POA: Diagnosis not present

## 2024-03-15 LAB — CBC
HCT: 38.2 % — ABNORMAL LOW (ref 39.0–52.0)
Hemoglobin: 12.4 g/dL — ABNORMAL LOW (ref 13.0–17.0)
MCH: 26.5 pg (ref 26.0–34.0)
MCHC: 32.5 g/dL (ref 30.0–36.0)
MCV: 81.6 fL (ref 80.0–100.0)
Platelets: 222 K/uL (ref 150–400)
RBC: 4.68 MIL/uL (ref 4.22–5.81)
RDW: 14.1 % (ref 11.5–15.5)
WBC: 14.3 K/uL — ABNORMAL HIGH (ref 4.0–10.5)
nRBC: 0 % (ref 0.0–0.2)

## 2024-03-15 LAB — BASIC METABOLIC PANEL WITH GFR
Anion gap: 10 (ref 5–15)
BUN: 9 mg/dL (ref 6–20)
CO2: 18 mmol/L — ABNORMAL LOW (ref 22–32)
Calcium: 8.6 mg/dL — ABNORMAL LOW (ref 8.9–10.3)
Chloride: 107 mmol/L (ref 98–111)
Creatinine, Ser: 0.64 mg/dL (ref 0.61–1.24)
GFR, Estimated: >60 mL/min (ref >60)
Glucose, Bld: 289 mg/dL — ABNORMAL HIGH (ref 70–99)
Potassium: 4.1 mmol/L (ref 3.5–5.1)
Sodium: 135 mmol/L (ref 135–145)

## 2024-03-15 MED ORDER — VITAMIN C 500 MG PO TABS: 1000.0000 mg | ORAL_TABLET | Freq: Every day | ORAL | Status: DC

## 2024-03-15 NOTE — Plan of Care (Signed)
  Problem: Education: Goal: Knowledge of General Education information will improve Description: Including pain rating scale, medication(s)/side effects and non-pharmacologic comfort measures Outcome: Progressing   Problem: Health Behavior/Discharge Planning: Goal: Ability to manage health-related needs will improve Outcome: Not Progressing   Problem: Activity: Goal: Risk for activity intolerance will decrease Outcome: Adequate for Discharge

## 2024-03-15 NOTE — Care Management Note (Signed)
 Mr. Daniel Bentley has left the the floor without notifying floor staff. Hospital security and hospital police notified.

## 2024-03-15 NOTE — Progress Notes (Incomplete)
 Police went looking for pt as he left with his iv in his arm and is an iv drug current user.  Police went to [facility] home and his family advised the police that Daniel Bentley died four years ago.  Police then went to [employer] brother's home across the street and gave the description of the pt that was using [employer], and they said that that man is unknown to him as well.  House sup contacted and MD made aware.

## 2024-03-15 NOTE — Progress Notes (Signed)
 Progress Note   Patient: Daniel Bentley FMW:[id number] DOB: 21-Aug-1990 DOA: 03/13/2024     2 DOS: the patient was seen and examined on 03/15/2024   Brief hospital course: Patient is a 33 year old male with past medical history significant for IV drug abuse and opiate dependence.  Patient was admitted with draining abscesses involving the left hand and right elbow.  Wound was cultured.  Final wound culture is still pending.  Patient was started on IV vancomycin .  The orthopedic surgery team performed incision and drainage of the right forearm, right elbow abscess and left hand abscess.  03/15/2024: Patient seen.  No new complaints.  Will continue IV vancomycin .  Follow final cultures.  Assessment and Plan: Purulent cellulitis and abscess b/l upper extremities  Continue vancomycin  as per pharmacy protocol. Follow blood cultures. WBC trending down. 03/15/2024: Patient underwent incision and drainage on 03/14/2024.  Follow final cultures.  Continue IV vancomycin  for now.  IVDA, opiate dependence  Discussed ill effects of IV drug use. Continue to monitor for withdrawal symptoms. TOC for substance use consulted.   Hyponatremia  -Resolved. - Sodium of 135 today. - Hyponatremia may be related to opiate use.  Hyperglycemia A1c has not been visualized.       Out of bed to chair. Incentive spirometry. Nursing supportive care. Fall, aspiration precautions. Diet:  Diet Orders (From admission, onward)     Start     Ordered   03/14/24 1551  Diet regular Room service appropriate? Yes with Assist; Fluid consistency: Thin  Diet effective now       Question Answer Comment  Room service appropriate? Yes with Assist   Fluid consistency: Thin      03/14/24 1551           DVT prophylaxis: SCD's Start: 03/15/24 1040 enoxaparin  (LOVENOX ) injection 40 mg Start: 03/13/24 2130  Level of care: Med-Surg   Code Status: Full Code  Subjective:  Patient is seen  No new complaints.    Physical  Exam: Vitals:   03/14/24 2320 03/14/24 2335 03/15/24 0001 03/15/24 0825  BP: 125/64 128/60 130/60 102/61  Pulse: 63 60 (!) 59 60  Resp: 17 17 16 16   Temp:  97.9 F (36.6 C) 97.6 F (36.4 C) 97.9 F (36.6 C)  TempSrc:    Oral  SpO2: 98% 100% 98% 99%  Weight:      Height:        General -continue any distress. HEENT -patient is pale. Lung - Clear to auscultation. Heart - S1, S2 heard. Abdomen -soft and nontender.    Skin - Wounds of upper extremity noted.          Data Reviewed:      Latest Ref Rng & Units 03/15/2024    5:15 AM 03/14/2024    3:04 PM 03/14/2024    5:16 AM  CBC  WBC 4.0 - 10.5 K/uL 14.3  16.6  14.3   Hemoglobin 13.0 - 17.0 g/dL 87.5  88.5  89.4   Hematocrit 39.0 - 52.0 % 38.2  33.8  30.6   Platelets 150 - 400 K/uL 222  190  167       Latest Ref Rng & Units 03/15/2024    5:15 AM 03/14/2024    5:16 AM 03/13/2024    2:52 PM  BMP  Glucose 70 - 99 mg/dL 710  820  887   BUN 6 - 20 mg/dL 9  9  10    Creatinine 0.61 - 1.24 mg/dL 9.35  9.35  0.85   Sodium 135 - 145 mmol/L 135  132  130   Potassium 3.5 - 5.1 mmol/L 4.1  3.5  3.8   Chloride 98 - 111 mmol/L 107  103  98   CO2 22 - 32 mmol/L 18  20  20    Calcium 8.9 - 10.3 mg/dL 8.6  8.0  8.3    DG Hand Complete Left Result Date: 03/13/2024 CLINICAL DATA:  Evaluate for osteomyelitis. Left hand x-ray 11/13/2015 EXAM: LEFT HAND - COMPLETE 3+ VIEW COMPARISON:  None Available. FINDINGS: There is soft tissue swelling and irregularity of the skin overlying the distal fifth metacarpal. There is a single punctate linear 1.5 mm foreign body within the soft tissues or on the skin surface. There is no underlying cortical erosion or periosteal reaction. There is no acute fracture or dislocation. There are moderate degenerative changes of the third distal interphalangeal joint. There are mild degenerative cystic changes of the proximal carpal row. IMPRESSION: Soft tissue swelling and irregularity overlying the distal fifth  metacarpal. There is a single punctate linear foreign body within the soft tissues or on the skin surface. No radiographic evidence of osteomyelitis. Electronically Signed   By: Greig Pique M.D.   On: 03/13/2024 19:38   DG Forearm Right Result Date: 03/13/2024 CLINICAL DATA:  Abscess on left hand. EXAM: RIGHT FOREARM - 2 VIEW COMPARISON:  None Available. FINDINGS: There is no evidence of fracture or other focal bone lesions. There is soft tissue swelling of the forearm. No radiopaque foreign body. IMPRESSION: Soft tissue swelling of the forearm. No radiopaque foreign body. Electronically Signed   By: Greig Pique M.D.   On: 03/13/2024 19:27   DG Chest 2 View Result Date: 03/13/2024 EXAM: 2 VIEW(S) XRAY OF THE CHEST 03/13/2024 03:08:00 PM COMPARISON: None available. CLINICAL HISTORY: 10220 Abscess [id number]. Reason for exam: Abscess; Triage notes: Abscess on left hand, states it has been there for a couple weeks. Pt complains of abscess x3, right sided chest pain. FINDINGS: LUNGS AND PLEURA: No focal pulmonary opacity. No pulmonary edema. No pleural effusion. No pneumothorax. Diffuse interstitial thickening and coarsening. HEART AND MEDIASTINUM: No acute abnormality of the cardiac and mediastinal silhouettes. BONES AND SOFT TISSUES: No acute osseous abnormality. IMPRESSION: 1. No acute process. 2. diffuse interstitial coarsening, likely related to smoking -chronic bronchitis. Electronically signed by: Rockey Kilts MD 03/13/2024 03:21 PM EDT RP Workstation: [id number]    Family Communication: Discussed with patient, understand and agree. All questions answered.  Disposition: Status is: Inpatient Remains inpatient appropriate because: IV antibiotic, hand surgery  Planned Discharge Destination: Home     Time spent: 35 minutes  Author: Leatrice LILLETTE Chapel, MD 03/15/2024 12:55 PM Secure chat 7am to 7pm For on call review [email or web address].

## 2024-03-15 NOTE — Progress Notes (Signed)
 Pt refused vitals and blood collection this am. Education provided

## 2024-03-16 ENCOUNTER — Encounter (HOSPITAL_COMMUNITY): Payer: Self-pay | Admitting: Orthopedic Surgery

## 2024-03-16 LAB — HEMOGLOBIN A1C
Hgb A1c MFr Bld: 6.4 % — ABNORMAL HIGH (ref 4.8–5.6)
Mean Plasma Glucose: 137 mg/dL

## 2024-03-16 NOTE — Anesthesia Postprocedure Evaluation (Signed)
 Anesthesia Post Note  Patient: Daniel Bentley  Procedure(s) Performed: IRRIGATION AND DEBRIDEMENT WOUND (Bilateral)     Patient location during evaluation: PACU Anesthesia Type: General Level of consciousness: awake and alert Pain management: pain level controlled Vital Signs Assessment: post-procedure vital signs reviewed and stable Respiratory status: spontaneous breathing, nonlabored ventilation, respiratory function stable and patient connected to nasal cannula oxygen Cardiovascular status: blood pressure returned to baseline and stable Postop Assessment: no apparent nausea or vomiting Anesthetic complications: no   No notable events documented.  Last Vitals:  Vitals:   03/15/24 1609 03/15/24 1612  BP: (!) 123/57   Pulse: (!) 55   Resp: 16 12  Temp: 36.7 C   SpO2: 100%     Last Pain:  Vitals:   03/15/24 1657  TempSrc:   PainSc: 8                  Thom JONELLE Peoples

## 2024-03-18 LAB — CULTURE, BLOOD (ROUTINE X 2)
Culture: NO GROWTH
Special Requests: ADEQUATE

## 2024-03-18 NOTE — Discharge Summary (Signed)
 Physician Discharge Summary  Patient ID: Daniel Bentley MRN: [id number] DOB/AGE: 34/04/1991 34 y.o.  Admit date: 03/13/2024   Discharge date: Patient left the hospital on 03/15/2024 with thoughts signing AGAINST MEDICAL ADVICE form.  Kindly see documentation by Josefa CHRISTELLA Landsman, RN, done on 03/15/2024 at 7:35 PM.  Hospital Course: Patient is a 33 year old male with past medical history significant for IV drug abuse and opiate dependence.  Patient was admitted with draining abscesses involving the left hand and right elbow.  Patient was admitted for further assessment and management.  Wound was cultured, however, final wound culture result is still pending.  Patient was managed with IV vancomycin .  The orthopedic surgery team was consulted to assist with patient's management.  Patient underwent incision and drainage of the right forearm, right elbow abscess and left hand abscess.  Patient left the hospital on 03/15/2024 without notifying anyone (according to the nursing staff-see above documentation).  Consults: orthopedic surgery  Significant Diagnostic Studies:  Wound culture.  Discharge Exam: Blood pressure (!) 123/57, pulse (!) 55, temperature 98.1 F (36.7 C), temperature source Oral, resp. rate 12, height 5' 6 (1.676 m), weight 68 kg, SpO2 100%. \  Disposition:    Allergies as of 03/16/2024   No Known Allergies      Medication List    You have not been prescribed any medications.     Follow-up Information     Murrell Drivers, MD. Call.   Specialty: Orthopedic Surgery Contact information: [address] [place] [address] [phone]                 Signed: Leatrice LILLETTE Chapel 03/18/2024, 7:36 PM

## 2024-03-19 LAB — AEROBIC/ANAEROBIC CULTURE W GRAM STAIN (SURGICAL/DEEP WOUND)

## 2024-03-19 LAB — CULTURE, BLOOD (ROUTINE X 2)
Culture: NO GROWTH
Special Requests: ADEQUATE
